# Patient Record
Sex: Male | Born: 1945 | Race: White | Hispanic: No | Marital: Married | Smoking: Former smoker
Health system: Southern US, Community
[De-identification: ages and names within clinical notes are randomized; demographics above are authoritative.]

## PROBLEM LIST (undated history)

## (undated) DIAGNOSIS — Z85038 Personal history of other malignant neoplasm of large intestine: Secondary | ICD-10-CM

## (undated) DIAGNOSIS — I10 Essential (primary) hypertension: Secondary | ICD-10-CM

## (undated) DIAGNOSIS — E7800 Pure hypercholesterolemia, unspecified: Secondary | ICD-10-CM

## (undated) DIAGNOSIS — K5900 Constipation, unspecified: Secondary | ICD-10-CM

## (undated) DIAGNOSIS — M5441 Lumbago with sciatica, right side: Secondary | ICD-10-CM

## (undated) DIAGNOSIS — I712 Thoracic aortic aneurysm, without rupture, unspecified: Secondary | ICD-10-CM

## (undated) DIAGNOSIS — M79604 Pain in right leg: Secondary | ICD-10-CM

## (undated) DIAGNOSIS — I2510 Atherosclerotic heart disease of native coronary artery without angina pectoris: Secondary | ICD-10-CM

## (undated) DIAGNOSIS — M1990 Unspecified osteoarthritis, unspecified site: Secondary | ICD-10-CM

## (undated) DIAGNOSIS — C160 Malignant neoplasm of cardia: Secondary | ICD-10-CM

## (undated) HISTORY — DX: Constipation, unspecified: K59.00

## (undated) HISTORY — DX: Pure hypercholesterolemia, unspecified: E78.00

## (undated) HISTORY — DX: Atherosclerotic heart disease of native coronary artery without angina pectoris: I25.10

## (undated) HISTORY — DX: Essential (primary) hypertension: I10

## (undated) HISTORY — DX: Pain in right leg: M79.604

## (undated) HISTORY — PX: CHOLECYSTECTOMY: SHX55

## (undated) HISTORY — DX: Personal history of other malignant neoplasm of large intestine: Z85.038

## (undated) HISTORY — DX: Malignant neoplasm of cardia: C16.0

## (undated) HISTORY — PX: PARTIAL COLECTOMY: SHX5273

## (undated) HISTORY — DX: Lumbago with sciatica, right side: M54.41

## (undated) HISTORY — DX: Unspecified osteoarthritis, unspecified site: M19.90

## (undated) HISTORY — DX: Thoracic aortic aneurysm, without rupture, unspecified: I71.20

---

## 2012-12-26 ENCOUNTER — Ambulatory Visit: Payer: Self-pay | Admitting: Ophthalmology

## 2013-01-16 ENCOUNTER — Ambulatory Visit: Payer: Self-pay | Admitting: Ophthalmology

## 2013-02-06 ENCOUNTER — Ambulatory Visit: Payer: Self-pay | Admitting: Ophthalmology

## 2014-11-15 DIAGNOSIS — Z8522 Personal history of malignant neoplasm of nasal cavities, middle ear, and accessory sinuses: Secondary | ICD-10-CM | POA: Diagnosis not present

## 2014-11-15 DIAGNOSIS — J189 Pneumonia, unspecified organism: Secondary | ICD-10-CM | POA: Diagnosis not present

## 2014-11-15 DIAGNOSIS — M1990 Unspecified osteoarthritis, unspecified site: Secondary | ICD-10-CM | POA: Diagnosis not present

## 2014-11-15 DIAGNOSIS — Z87891 Personal history of nicotine dependence: Secondary | ICD-10-CM | POA: Diagnosis not present

## 2014-11-15 DIAGNOSIS — Z1389 Encounter for screening for other disorder: Secondary | ICD-10-CM | POA: Diagnosis not present

## 2014-11-15 DIAGNOSIS — R0789 Other chest pain: Secondary | ICD-10-CM | POA: Diagnosis not present

## 2014-11-15 DIAGNOSIS — I7090 Unspecified atherosclerosis: Secondary | ICD-10-CM | POA: Diagnosis not present

## 2014-11-15 DIAGNOSIS — Z6828 Body mass index (BMI) 28.0-28.9, adult: Secondary | ICD-10-CM | POA: Diagnosis not present

## 2014-11-15 DIAGNOSIS — E780 Pure hypercholesterolemia: Secondary | ICD-10-CM | POA: Diagnosis not present

## 2014-11-15 DIAGNOSIS — I10 Essential (primary) hypertension: Secondary | ICD-10-CM | POA: Diagnosis not present

## 2014-11-15 DIAGNOSIS — Z125 Encounter for screening for malignant neoplasm of prostate: Secondary | ICD-10-CM | POA: Diagnosis not present

## 2014-11-28 DIAGNOSIS — R918 Other nonspecific abnormal finding of lung field: Secondary | ICD-10-CM | POA: Diagnosis not present

## 2014-11-28 DIAGNOSIS — J189 Pneumonia, unspecified organism: Secondary | ICD-10-CM | POA: Diagnosis not present

## 2014-12-02 DIAGNOSIS — R0602 Shortness of breath: Secondary | ICD-10-CM | POA: Diagnosis not present

## 2014-12-02 DIAGNOSIS — Z6828 Body mass index (BMI) 28.0-28.9, adult: Secondary | ICD-10-CM | POA: Diagnosis not present

## 2014-12-02 DIAGNOSIS — J189 Pneumonia, unspecified organism: Secondary | ICD-10-CM | POA: Diagnosis not present

## 2015-02-18 DIAGNOSIS — R42 Dizziness and giddiness: Secondary | ICD-10-CM | POA: Diagnosis not present

## 2015-02-28 NOTE — Op Note (Signed)
PATIENT NAME:  Patrick Braun, Patrick Braun MR#:  [id number] DATE OF BIRTH:  Aug 10, 1946  DATE OF PROCEDURE:  02/06/2013  PREOPERATIVE DIAGNOSIS: Visually significant cataract of the left eye.   POSTOPERATIVE DIAGNOSIS: Visually significant cataract of the left eye.   OPERATIVE PROCEDURE: Cataract extraction by phacoemulsification with implant of intraocular lens to left eye.   SURGEON: Birder Robson, MD.   ANESTHESIA:  1. Managed anesthesia care.  2. Topical tetracaine drops followed by 2% Xylocaine jelly applied in the preoperative holding area.   COMPLICATIONS: None.   TECHNIQUE:  Stop and chop.   DESCRIPTION OF PROCEDURE: The patient was examined and consented in the preoperative holding area where the aforementioned topical anesthesia was applied to the left eye and then brought back to the Operating Room where the left eye was prepped and draped in the usual sterile ophthalmic fashion and a lid speculum was placed. A paracentesis was created with the side port blade and the anterior chamber was filled with viscoelastic. A near clear corneal incision was performed with the steel keratome. A continuous curvilinear capsulorrhexis was performed with a cystotome followed by the capsulorrhexis forceps. Hydrodissection and hydrodelineation were carried out with BSS on a blunt cannula. The lens was removed in a   technique and the remaining cortical material was removed with the irrigation-aspiration handpiece. The capsular bag was inflated with viscoelastic and the Alcon SN60WF Tecnis ZCB00 19.5-diopter lens, serial number [id number] was placed in the capsular bag without complication. The remaining viscoelastic was removed from the eye with the irrigation-aspiration handpiece. The wounds were hydrated. The anterior chamber was flushed with Miostat and the eye was inflated to physiologic pressure. 0.1 mL of cefuroxime concentration 10 mg/mL was placed in the anterior chamber. The wounds were found to be water  tight. The eye was dressed with Vigamox. The patient was given protective glasses to wear throughout the day and a shield with which to sleep tonight. The patient was also given drops with which to begin a drop regimen today and will follow-up with me in one day.     ____________________________ Livingston Diones. Desere Gwin, MD wlp:ea D: 02/06/2013 22:03:09 ET T: 02/07/2013 00:46:40 ET JOB#: [id number]  cc: Shakyla Nolley L. Tamari Busic, MD, <Dictator> Livingston Diones Lorrain Rivers MD ELECTRONICALLY SIGNED 02/07/2013 10:30

## 2015-02-28 NOTE — Op Note (Signed)
PATIENT NAME:  Patrick Braun, HINEMAN MR#:  [id number] DATE OF BIRTH:  [birth year]  DATE OF PROCEDURE:  01/16/2013  PREOPERATIVE DIAGNOSIS: Visually significant cataract of the right eye.   POSTOPERATIVE DIAGNOSIS: Visually significant cataract of the right eye.   OPERATIVE PROCEDURE: Cataract extraction by phacoemulsification with implant of intraocular lens to right eye.   SURGEON: Birder Robson, MD.   ANESTHESIA:  1. Managed anesthesia care.  2. Topical tetracaine drops followed by 2% Xylocaine jelly applied in the preoperative holding area.   COMPLICATIONS: None.   TECHNIQUE:  Stop and chop   DESCRIPTION OF PROCEDURE: The patient was examined and consented in the preoperative holding area where the aforementioned topical anesthesia was applied to the right eye and then brought back to the Operating Room where the right eye was prepped and draped in the usual sterile ophthalmic fashion and a lid speculum was placed. A paracentesis was created with the side port blade and the anterior chamber was filled with viscoelastic. A near clear corneal incision was performed with the steel keratome. A continuous curvilinear capsulorrhexis was performed with a cystotome followed by the capsulorrhexis forceps. Hydrodissection and hydrodelineation were carried out with BSS on a blunt cannula. The lens was removed in a stop and chop technique and the remaining cortical material was removed with the irrigation-aspiration handpiece. The capsular bag was inflated with viscoelastic and the Tecnis ZCB00 20.0 diopter lens, serial #[id number] was placed in the capsular bag without complication. The remaining viscoelastic was removed from the eye with the irrigation-aspiration handpiece. The wounds were hydrated. The anterior chamber was flushed with Miostat and the eye was inflated to physiologic pressure. 0.1 mL of cefuroxime concentration 10 mg/mL was placed in the anterior chamber. The wounds were found to be water  tight. The eye was dressed with Vigamox. The patient was given protective glasses to wear throughout the day and a shield with which to sleep tonight. The patient was also given drops with which to begin a drop regimen today and will follow-up with me in one day.    ____________________________ Livingston Diones. Porfilio, MD wlp:cc D: 01/16/2013 21:52:29 ET T: 01/16/2013 23:21:05 ET JOB#: [id number]  cc: William L. Porfilio, MD, <Dictator> Livingston Diones PORFILIO MD ELECTRONICALLY SIGNED 01/19/2013 17:10

## 2015-09-11 DIAGNOSIS — Z23 Encounter for immunization: Secondary | ICD-10-CM | POA: Diagnosis not present

## 2015-12-25 DIAGNOSIS — J209 Acute bronchitis, unspecified: Secondary | ICD-10-CM | POA: Diagnosis not present

## 2015-12-25 DIAGNOSIS — J309 Allergic rhinitis, unspecified: Secondary | ICD-10-CM | POA: Diagnosis not present

## 2016-05-31 DIAGNOSIS — Z85038 Personal history of other malignant neoplasm of large intestine: Secondary | ICD-10-CM | POA: Diagnosis not present

## 2016-05-31 DIAGNOSIS — Z23 Encounter for immunization: Secondary | ICD-10-CM | POA: Diagnosis not present

## 2016-05-31 DIAGNOSIS — Z0000 Encounter for general adult medical examination without abnormal findings: Secondary | ICD-10-CM | POA: Diagnosis not present

## 2016-05-31 DIAGNOSIS — Z6827 Body mass index (BMI) 27.0-27.9, adult: Secondary | ICD-10-CM | POA: Diagnosis not present

## 2016-05-31 DIAGNOSIS — E7800 Pure hypercholesterolemia, unspecified: Secondary | ICD-10-CM | POA: Diagnosis not present

## 2016-05-31 DIAGNOSIS — I10 Essential (primary) hypertension: Secondary | ICD-10-CM | POA: Diagnosis not present

## 2016-06-08 DIAGNOSIS — Z85038 Personal history of other malignant neoplasm of large intestine: Secondary | ICD-10-CM | POA: Diagnosis not present

## 2016-06-08 DIAGNOSIS — Z6827 Body mass index (BMI) 27.0-27.9, adult: Secondary | ICD-10-CM | POA: Diagnosis not present

## 2016-06-14 DIAGNOSIS — K5730 Diverticulosis of large intestine without perforation or abscess without bleeding: Secondary | ICD-10-CM | POA: Diagnosis not present

## 2016-06-14 DIAGNOSIS — Z1211 Encounter for screening for malignant neoplasm of colon: Secondary | ICD-10-CM | POA: Diagnosis not present

## 2016-06-14 DIAGNOSIS — Z79899 Other long term (current) drug therapy: Secondary | ICD-10-CM | POA: Diagnosis not present

## 2016-06-14 DIAGNOSIS — Z85038 Personal history of other malignant neoplasm of large intestine: Secondary | ICD-10-CM | POA: Diagnosis not present

## 2016-07-05 DIAGNOSIS — S60221A Contusion of right hand, initial encounter: Secondary | ICD-10-CM | POA: Diagnosis not present

## 2016-10-01 DIAGNOSIS — J029 Acute pharyngitis, unspecified: Secondary | ICD-10-CM | POA: Diagnosis not present

## 2016-10-02 DIAGNOSIS — J029 Acute pharyngitis, unspecified: Secondary | ICD-10-CM | POA: Diagnosis not present

## 2016-10-02 DIAGNOSIS — R05 Cough: Secondary | ICD-10-CM | POA: Diagnosis not present

## 2016-10-02 DIAGNOSIS — R1310 Dysphagia, unspecified: Secondary | ICD-10-CM | POA: Diagnosis not present

## 2016-10-02 DIAGNOSIS — Z85038 Personal history of other malignant neoplasm of large intestine: Secondary | ICD-10-CM | POA: Diagnosis not present

## 2016-10-02 DIAGNOSIS — E7800 Pure hypercholesterolemia, unspecified: Secondary | ICD-10-CM | POA: Diagnosis not present

## 2016-10-02 DIAGNOSIS — Z87891 Personal history of nicotine dependence: Secondary | ICD-10-CM | POA: Diagnosis not present

## 2016-10-02 DIAGNOSIS — I10 Essential (primary) hypertension: Secondary | ICD-10-CM | POA: Diagnosis not present

## 2016-12-31 DIAGNOSIS — H10213 Acute toxic conjunctivitis, bilateral: Secondary | ICD-10-CM | POA: Diagnosis not present

## 2017-04-20 DIAGNOSIS — D3132 Benign neoplasm of left choroid: Secondary | ICD-10-CM | POA: Diagnosis not present

## 2017-10-11 DIAGNOSIS — E7800 Pure hypercholesterolemia, unspecified: Secondary | ICD-10-CM | POA: Diagnosis not present

## 2017-10-11 DIAGNOSIS — I10 Essential (primary) hypertension: Secondary | ICD-10-CM | POA: Diagnosis not present

## 2017-10-11 DIAGNOSIS — R0609 Other forms of dyspnea: Secondary | ICD-10-CM | POA: Diagnosis not present

## 2017-10-11 DIAGNOSIS — Z23 Encounter for immunization: Secondary | ICD-10-CM | POA: Diagnosis not present

## 2017-10-11 DIAGNOSIS — K219 Gastro-esophageal reflux disease without esophagitis: Secondary | ICD-10-CM | POA: Diagnosis not present

## 2017-10-11 DIAGNOSIS — Z0000 Encounter for general adult medical examination without abnormal findings: Secondary | ICD-10-CM | POA: Diagnosis not present

## 2017-10-11 DIAGNOSIS — Z87891 Personal history of nicotine dependence: Secondary | ICD-10-CM | POA: Diagnosis not present

## 2017-10-11 DIAGNOSIS — Z1159 Encounter for screening for other viral diseases: Secondary | ICD-10-CM | POA: Diagnosis not present

## 2020-02-07 NOTE — Progress Notes (Signed)
Chief Complaint  Patient presents with  . New Patient (Initial Visit)    dyspnea   History of Present Illness: 74 yo male with history of gastroesophageal cancer, colon cancer, HTN, hyperlipidemia who is here today as a new patient for the evaluation of dyspnea, chest pressure and fatigue. 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 He describes exertional dyspnea. 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 He becomes dyspneic easily now. No LE edema.   Primary Care Physician: Raelene Bott, MD   Past Medical History:  Diagnosis Date  . Acute right-sided low back pain with right-sided sciatica   . Constipation   . DJD (degenerative joint disease)   . Gastroesophageal cancer ([place])   . Hx of colon cancer, stage I   . Hypercholesteremia   . Hypertension   . Right leg pain     Past Surgical History:  Procedure Laterality Date  . CHOLECYSTECTOMY    . PARTIAL COLECTOMY      Current Outpatient Medications  Medication Sig Dispense Refill  . amlodipine-benazepril (LOTREL) 2.5-10 MG capsule Take 1 capsule by mouth daily.    . ASPIRIN 81 PO Take 1 tablet by mouth daily.    Marland Kitchen escitalopram (LEXAPRO) 10 MG tablet Take 10 mg by mouth daily.    Marland Kitchen loratadine (CLARITIN) 10 MG tablet Take 10 mg by mouth daily as needed for allergies.     No current facility-administered medications for this visit.    No Known Allergies  Social History   Socioeconomic History  . Marital status: Married    Spouse name: Not on file  . Number of children: Not on file  . Years of education: Not on file  . Highest education level: Not on file  Occupational History  . Occupation: Farmer  Tobacco Use  . Smoking status: Former Smoker    Quit date: 11/09/1971    Years since quitting: 48.2  . Smokeless tobacco: Never Used  Substance and Sexual Activity  . Alcohol use: Never  . Drug use: Not on file  . Sexual activity: Not on file  Other Topics Concern  . Not on file  Social History Narrative  . Not on file    Social Determinants of Health   Financial Resource Strain:   . Difficulty of Paying Living Expenses:   Food Insecurity:   . Worried About Charity fundraiser in the Last Year:   . Arboriculturist in the Last Year:   Transportation Needs:   . Film/video editor (Medical):   Marland Kitchen Lack of Transportation (Non-Medical):   Physical Activity:   . Days of Exercise per Week:   . Minutes of Exercise per Session:   Stress:   . Feeling of Stress :   Social Connections:   . Frequency of Communication with Friends and Family:   . Frequency of Social Gatherings with Friends and Family:   . Attends Religious Services:   . Active Member of Clubs or Organizations:   . Attends Archivist Meetings:   Marland Kitchen Marital Status:   Intimate Partner Violence:   . Fear of Current or Ex-Partner:   . Emotionally Abused:   Marland Kitchen Physically Abused:   . Sexually Abused:     Family History  Problem Relation Age of Onset  . Heart attack Father   . Stroke Mother     Review of Systems:  As stated in the HPI and otherwise negative.   BP 124/80   Pulse 68  Ht 6' (1.829 m)   Wt 197 lb (89.4 kg)   SpO2 99%   BMI 26.72 kg/m   Physical Examination: General: Well developed, well nourished, NAD  HEENT: OP clear, mucus membranes moist  SKIN: warm, dry. No rashes. Neuro: No focal deficits  Musculoskeletal: Muscle strength 5/5 all ext  Psychiatric: Mood and affect normal  Neck: No JVD, no carotid bruits, no thyromegaly, no lymphadenopathy.  Lungs:Clear bilaterally, no wheezes, rhonci, crackles Cardiovascular: Regular rate and rhythm. No murmurs, gallops or rubs. Abdomen:Soft. Bowel sounds present. Non-tender.  Extremities: No lower extremity edema. Pulses are 2 + in the bilateral DP/PT.  EKG:  EKG is ordered today. The ekg ordered today demonstrates Sinus, inferior Q waves  Recent Labs: No results found for requested labs within last 8760 hours.   Lipid Panel No results found for: CHOL, TRIG,  HDL, CHOLHDL, VLDL, LDLCALC, LDLDIRECT   Wt Readings from Last 3 Encounters:  02/08/20 197 lb (89.4 kg)    Assessment and Plan:   1.  Dyspnea and chest pressure: cardiac exam is normal. EKG shows inferior Q waves. Will arrange a nuclear stress test and an echo.   2. HTN: BP controlled. Continue current therapy  Current medicines are reviewed at length with the patient today.  The patient does not have concerns regarding medicines.  The following changes have been made:  no change  Labs/ tests ordered today include:   Orders Placed This Encounter  Procedures  . MYOCARDIAL PERFUSION IMAGING  . ECHOCARDIOGRAM COMPLETE     Disposition:   FU with me in 3-4 weeks.    Signed, Lauree Chandler, MD 02/08/2020 9:26 AM    [facility] [address], [place], [place]  [address] Phone: [phone]; Fax: [phone]

## 2020-02-08 ENCOUNTER — Ambulatory Visit (INDEPENDENT_AMBULATORY_CARE_PROVIDER_SITE_OTHER): Payer: Medicare Other | Admitting: Cardiovascular Disease

## 2020-02-08 ENCOUNTER — Encounter: Payer: Self-pay | Admitting: Nurse Practitioner

## 2020-02-08 ENCOUNTER — Other Ambulatory Visit: Payer: Self-pay

## 2020-02-08 ENCOUNTER — Encounter: Payer: Self-pay | Admitting: Cardiovascular Disease

## 2020-02-08 VITALS — BP 124/80 | HR 68 | Ht 72.0 in | Wt 197.0 lb

## 2020-02-08 DIAGNOSIS — R0609 Other forms of dyspnea: Secondary | ICD-10-CM

## 2020-02-08 DIAGNOSIS — R072 Precordial pain: Secondary | ICD-10-CM

## 2020-02-08 DIAGNOSIS — R0600 Dyspnea, unspecified: Secondary | ICD-10-CM

## 2020-02-08 NOTE — Patient Instructions (Signed)
Medication Instructions:  Your physician recommends that you continue on your current medications as directed. Please refer to the Current Medication list given to you today.  *If you need a refill on your cardiac medications before your next appointment, please call your pharmacy*   Lab Work: None Ordered If you have labs (blood work) drawn today and your tests are completely normal, you will receive your results only by: Marland Kitchen MyChart Message (if you have MyChart) OR . A paper copy in the mail If you have any lab test that is abnormal or we need to change your treatment, we will call you to review the results.   Testing/Procedures: Your physician has requested that you have an echocardiogram. Echocardiography is a painless test that uses sound waves to create images of your heart. It provides your doctor with information about the size and shape of your heart and how well your heart's chambers and valves are working. This procedure takes approximately one hour. There are no restrictions for this procedure.  Your Pre-procedure COVID-19 Testing (needed before your Exercise Stress Test) will be done on ______ at [facility] at [id number], [place], [place] [address]. Once you arrive at the testing site, stay in the right hand lane, go under the building overhang not the tent. If you are tested under the tent your results may not be back before your procedure. Please be on time for your appointment.  After your swab you will be given a mask to wear and instructed to go home and quarantine/no visitors until after your procedure. If you test positive you will be notified and your procedure will be cancelled.    Your physician has requested that you have an exercise stress myoview. For further information please visit [email or web address]. Please follow instruction sheet, as given.    Follow-Up: At [facility], you and your health needs are our priority.  As part  of our continuing mission to provide you with exceptional heart care, we have created designated Provider Care Teams.  These Care Teams include your primary Cardiologist (physician) and Advanced Practice Providers (APPs -  Physician Assistants and Nurse Practitioners) who all work together to provide you with the care you need, when you need it.  We recommend signing up for the patient portal called "MyChart".  Sign up information is provided on this After Visit Summary.  MyChart is used to connect with patients for Virtual Visits (Telemedicine).  Patients are able to view lab/test results, encounter notes, upcoming appointments, etc.  Non-urgent messages can be sent to your provider as well.   To learn more about what you can do with MyChart, go to [email or web address].    Your next appointment:   4 week(s) after Stress Test and Echo  The format for your next appointment:   In Person  Provider:   Lauree Chandler, MD

## 2020-02-13 ENCOUNTER — Telehealth (HOSPITAL_COMMUNITY): Payer: Self-pay | Admitting: *Deleted

## 2020-02-13 NOTE — Telephone Encounter (Signed)
Patient given detailed instructions per Myocardial Perfusion Study Information Sheet for the test on 02/18/20 at 8:00. Patient notified to arrive 15 minutes early and that it is imperative to arrive on time for appointment to keep from having the test rescheduled.  If you need to cancel or reschedule your appointment, please call the office within 24 hours of your appointment. . Patient verbalized understanding.Patrick Braun

## 2020-02-14 ENCOUNTER — Other Ambulatory Visit (HOSPITAL_COMMUNITY)
Admission: RE | Admit: 2020-02-14 | Discharge: 2020-02-14 | Disposition: A | Discharge status: Home / Self Care | Payer: Medicare Other | Source: Ambulatory Visit | Attending: Cardiovascular Disease | Admitting: Cardiovascular Disease

## 2020-02-14 DIAGNOSIS — Z20822 Contact with and (suspected) exposure to covid-19: Secondary | ICD-10-CM | POA: Insufficient documentation

## 2020-02-14 DIAGNOSIS — Z01812 Encounter for preprocedural laboratory examination: Secondary | ICD-10-CM | POA: Insufficient documentation

## 2020-02-14 LAB — SARS CORONAVIRUS 2 (TAT 6-24 HRS): SARS Coronavirus 2: NEGATIVE

## 2020-02-18 ENCOUNTER — Ambulatory Visit (HOSPITAL_COMMUNITY): Payer: Medicare Other | Attending: Cardiology

## 2020-02-18 ENCOUNTER — Other Ambulatory Visit: Payer: Self-pay

## 2020-02-18 DIAGNOSIS — R072 Precordial pain: Secondary | ICD-10-CM

## 2020-02-18 LAB — MYOCARDIAL PERFUSION IMAGING
Estimated workload: 10.1 METS
Exercise duration (min): 8 min
Exercise duration (sec): 48 s
LV dias vol: 69 mL (ref 62–150)
LV sys vol: 25 mL
MPHR: 145 {beats}/min
Peak HR: 146 {beats}/min
Percent HR: 100 %
Rest HR: 68 {beats}/min
SDS: 1
SRS: 0
SSS: 4
TID: 0.69

## 2020-02-18 MED ORDER — TECHNETIUM TC 99M TETROFOSMIN IV KIT
31.6000 | PACK | Freq: Once | INTRAVENOUS | Status: AC | PRN
  Administered 2020-02-18: 31.6 via INTRAVENOUS
  Filled 2020-02-18: qty 32

## 2020-02-18 MED ORDER — TECHNETIUM TC 99M TETROFOSMIN IV KIT
10.4000 | PACK | Freq: Once | INTRAVENOUS | Status: AC | PRN
  Administered 2020-02-18: 10.4 via INTRAVENOUS
  Filled 2020-02-18: qty 11

## 2020-02-19 ENCOUNTER — Ambulatory Visit (HOSPITAL_COMMUNITY): Payer: Medicare Other | Attending: Internal Medicine

## 2020-02-19 DIAGNOSIS — R0600 Dyspnea, unspecified: Secondary | ICD-10-CM | POA: Diagnosis not present

## 2020-02-19 DIAGNOSIS — R072 Precordial pain: Secondary | ICD-10-CM | POA: Diagnosis not present

## 2020-02-19 DIAGNOSIS — R0609 Other forms of dyspnea: Secondary | ICD-10-CM

## 2020-02-19 NOTE — Addendum Note (Signed)
Addended by: Janan Halter F on: 02/19/2020 10:23 AM   Modules accepted: Orders

## 2020-02-20 ENCOUNTER — Telehealth: Payer: Self-pay | Admitting: *Deleted

## 2020-02-20 DIAGNOSIS — I77810 Thoracic aortic ectasia: Secondary | ICD-10-CM

## 2020-02-20 NOTE — Telephone Encounter (Signed)
-----   Message from Burnell Blanks, MD sent at 02/19/2020 12:09 PM EDT ----- His heart is strong. There is mild valve disease. His aortic root is mildly enlarged. Can we let him know and arrange a chest CTA to evaluate his aorta? Thanks, chris

## 2020-02-20 NOTE — Telephone Encounter (Signed)
I spoke with patient and reviewed echo and stress test results with him. He will need BMP prior to CTA.  He is currently out of town and will call us next week to schedule lab work.  Order for CTA and lab work can be placed at that time.

## 2020-02-27 NOTE — Telephone Encounter (Signed)
Patient's wife called to let us know that Patrick Braun is back in town. She would like a call back to schedule lab work. I did not see any orders in.

## 2020-02-27 NOTE — Telephone Encounter (Signed)
Placed orders for BMET and CTA chest to look at aorta. Pt will come tomorrow for BMET. He has f/u with Dr. Angelena Form on 5/3 and would prefer to have scan done ASAP.  Note to [facility] for scheduling.

## 2020-02-28 ENCOUNTER — Other Ambulatory Visit: Payer: Medicare Other | Admitting: *Deleted

## 2020-02-28 ENCOUNTER — Other Ambulatory Visit: Payer: Self-pay

## 2020-02-28 DIAGNOSIS — I77810 Thoracic aortic ectasia: Secondary | ICD-10-CM

## 2020-02-28 LAB — BASIC METABOLIC PANEL
BUN/Creatinine Ratio: 9 — ABNORMAL LOW (ref 10–24)
BUN: 9 mg/dL (ref 8–27)
CO2: 25 mmol/L (ref 20–29)
Calcium: 9.7 mg/dL (ref 8.6–10.2)
Chloride: 104 mmol/L (ref 96–106)
Creatinine, Ser: 1.05 mg/dL (ref 0.76–1.27)
GFR calc Af Amer: 80 mL/min/{1.73_m2} (ref >59)
GFR calc non Af Amer: 69 mL/min/{1.73_m2} (ref >59)
Glucose: 73 mg/dL (ref 65–99)
Potassium: 4.2 mmol/L (ref 3.5–5.2)
Sodium: 140 mmol/L (ref 134–144)

## 2020-03-05 ENCOUNTER — Other Ambulatory Visit: Payer: Self-pay

## 2020-03-05 ENCOUNTER — Ambulatory Visit (HOSPITAL_COMMUNITY)
Admission: RE | Admit: 2020-03-05 | Discharge: 2020-03-05 | Disposition: A | Discharge status: Home / Self Care | Payer: Medicare Other | Source: Ambulatory Visit | Attending: Cardiovascular Disease | Admitting: Cardiovascular Disease

## 2020-03-05 DIAGNOSIS — I77810 Thoracic aortic ectasia: Secondary | ICD-10-CM | POA: Diagnosis not present

## 2020-03-05 MED ORDER — IOHEXOL 350 MG/ML SOLN
100.0000 mL | Freq: Once | INTRAVENOUS | Status: AC | PRN
  Administered 2020-03-05: 100 mL via INTRAVENOUS

## 2020-03-10 ENCOUNTER — Other Ambulatory Visit: Payer: Self-pay

## 2020-03-10 ENCOUNTER — Encounter: Payer: Self-pay | Admitting: Cardiovascular Disease

## 2020-03-10 ENCOUNTER — Ambulatory Visit (INDEPENDENT_AMBULATORY_CARE_PROVIDER_SITE_OTHER): Payer: Medicare Other | Admitting: Cardiovascular Disease

## 2020-03-10 VITALS — BP 126/80 | HR 75 | Ht 72.0 in | Wt 198.8 lb

## 2020-03-10 DIAGNOSIS — R0609 Other forms of dyspnea: Secondary | ICD-10-CM

## 2020-03-10 DIAGNOSIS — I77810 Thoracic aortic ectasia: Secondary | ICD-10-CM | POA: Diagnosis not present

## 2020-03-10 DIAGNOSIS — R0600 Dyspnea, unspecified: Secondary | ICD-10-CM

## 2020-03-10 NOTE — Progress Notes (Signed)
Chief Complaint  Patient presents with  . Follow-up    Dyspnea   History of Present Illness: 74 yo male with history of gastroesophageal cancer, colon cancer, HTN, hyperlipidemia who is here today for cardiac follow up. I saw him as a new patient for the evaluation of dyspnea, chest pressure and fatigue on 02/08/20. 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 He described exertional dyspnea. 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 He becomes dyspneic easily now. No LE edema. Nuclear stress test 02/18/20 with no ischemia. Echo April 2021 with LVEF=55-60% with no wall motion abnormalities. Mild MR. Mild AI. Mild dilation aortic root. Chest CTA with mild dilation of the aortic root, 4.4 cm.   He is here today for follow up. The patient denies any chest pain, palpitations, lower extremity edema, orthopnea, PND, dizziness, near syncope or syncope. He continues to have dyspnea with heavy exertion.   Primary Care Physician: Raelene Bott, MD   Past Medical History:  Diagnosis Date  . Acute right-sided low back pain with right-sided sciatica   . Constipation   . DJD (degenerative joint disease)   . Gastroesophageal cancer ([place])   . Hx of colon cancer, stage I   . Hypercholesteremia   . Hypertension   . Right leg pain     Past Surgical History:  Procedure Laterality Date  . CHOLECYSTECTOMY    . PARTIAL COLECTOMY      Current Outpatient Medications  Medication Sig Dispense Refill  . amlodipine-benazepril (LOTREL) 2.5-10 MG capsule Take 1 capsule by mouth daily.    . ASPIRIN 81 PO Take 1 tablet by mouth daily.    Marland Kitchen escitalopram (LEXAPRO) 10 MG tablet Take 10 mg by mouth daily.    Marland Kitchen loratadine (CLARITIN) 10 MG tablet Take 10 mg by mouth daily as needed for allergies.     No current facility-administered medications for this visit.    No Known Allergies  Social History   Socioeconomic History  . Marital status: Married    Spouse name: Not on file  . Number of children: Not on file  . Years  of education: Not on file  . Highest education level: Not on file  Occupational History  . Occupation: Farmer  Tobacco Use  . Smoking status: Former Smoker    Quit date: 11/09/1971    Years since quitting: 48.3  . Smokeless tobacco: Never Used  Substance and Sexual Activity  . Alcohol use: Never  . Drug use: Never  . Sexual activity: Not on file  Other Topics Concern  . Not on file  Social History Narrative  . Not on file   Social Determinants of Health   Financial Resource Strain:   . Difficulty of Paying Living Expenses:   Food Insecurity:   . Worried About Charity fundraiser in the Last Year:   . Arboriculturist in the Last Year:   Transportation Needs:   . Film/video editor (Medical):   Marland Kitchen Lack of Transportation (Non-Medical):   Physical Activity:   . Days of Exercise per Week:   . Minutes of Exercise per Session:   Stress:   . Feeling of Stress :   Social Connections:   . Frequency of Communication with Friends and Family:   . Frequency of Social Gatherings with Friends and Family:   . Attends Religious Services:   . Active Member of Clubs or Organizations:   . Attends Archivist Meetings:   Marland Kitchen Marital Status:  Intimate Partner Violence:   . Fear of Current or Ex-Partner:   . Emotionally Abused:   Marland Kitchen Physically Abused:   . Sexually Abused:     Family History  Problem Relation Age of Onset  . Heart attack Father   . Stroke Mother     Review of Systems:  As stated in the HPI and otherwise negative.   BP 126/80   Pulse 75   Ht 6' (1.829 m)   Wt 198 lb 12.8 oz (90.2 kg)   SpO2 99%   BMI 26.96 kg/m   Physical Examination: General: Well developed, well nourished, NAD  HEENT: OP clear, mucus membranes moist  SKIN: warm, dry. No rashes. Neuro: No focal deficits  Musculoskeletal: Muscle strength 5/5 all ext  Psychiatric: Mood and affect normal  Neck: No JVD, no carotid bruits, no thyromegaly, no lymphadenopathy.  Lungs:Clear bilaterally,  no wheezes, rhonci, crackles Cardiovascular: Regular rate and rhythm. No murmurs, gallops or rubs. Abdomen:Soft. Bowel sounds present. Non-tender.  Extremities: No lower extremity edema. Pulses are 2 + in the bilateral DP/PT.  EKG:  EKG is not ordered today. The ekg ordered today demonstrates   Echo April 2021: 1. Left ventricular ejection fraction, by estimation, is 55 to 60%. The  left ventricle has normal function. The left ventricle has no regional  wall motion abnormalities. There is mild asymmetric left ventricular  hypertrophy of the basal-septal segment.  Left ventricular diastolic parameters were normal.  2. Right ventricular systolic function is normal. The right ventricular  size is normal. There is normal pulmonary artery systolic pressure. The  estimated right ventricular systolic pressure is 123XX123 mmHg.  3. Mild chordal SAM. The mitral valve is abnormal. Mild mitral valve  regurgitation. No evidence of mitral stenosis.  4. The aortic valve is abnormal. Aortic valve regurgitation is mild. Mild  aortic valve sclerosis is present, with no evidence of aortic valve  stenosis.  5. Aortic dilatation noted. There is mild dilatation of the aortic root  and of the ascending aorta measuring 42 mm.   Recent Labs: 02/28/2020: BUN 9; Creatinine, Ser 1.05; Potassium 4.2; Sodium 140   Lipid Panel No results found for: CHOL, TRIG, HDL, CHOLHDL, VLDL, LDLCALC, LDLDIRECT   Wt Readings from Last 3 Encounters:  03/10/20 198 lb 12.8 oz (90.2 kg)  02/08/20 197 lb (89.4 kg)    Assessment and Plan:   1.  Dyspnea and chest pressure: No ischemia on stress test. LV function is normal. He is asked to call us if his symptoms worsen. The next step would be a cardiac cath.    2. HTN: BP is controlled. Continue current therapy.   3. 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 Repeat one year.   Current medicines are reviewed at length with the patient today.   The patient does not have concerns regarding medicines.  The following changes have been made:  no change  Labs/ tests ordered today include:   No orders of the defined types were placed in this encounter.   Disposition:   FU with me in 12 months.    Signed, Lauree Chandler, MD 03/10/2020 2:58 PM    [facility] [place], [place], [place]  [address] Phone: [phone]; Fax: [phone]

## 2020-03-10 NOTE — Patient Instructions (Signed)

## 2020-03-14 ENCOUNTER — Other Ambulatory Visit: Payer: Medicare Other

## 2021-03-24 ENCOUNTER — Other Ambulatory Visit: Payer: Self-pay | Admitting: *Deleted

## 2021-04-23 DIAGNOSIS — I712 Thoracic aortic aneurysm, without rupture, unspecified: Secondary | ICD-10-CM | POA: Insufficient documentation

## 2021-04-23 NOTE — Progress Notes (Signed)
, No Cardiology Office Note:    Date:  04/24/2021   ID:  Patrick Braun, DOB 04-29-1946, MRN [id number]  PCP:  Raelene Bott, MD   [facility] Providers Cardiologist:  Lauree Chandler, MD      Referring MD: Raelene Bott, MD   Chief Complaint:  Follow-up (Shortness of breath, chest pain )    Patient Profile:    Patrick Braun is a 74 y.o. male with:  Gastroesophageal CA Colon CA Hypertension  Hyperlipidemia  Rheumatic fever as a child Mild MR, Mild AI by echocardiogram in 2021 Dyspnea, chest pain  Myoview 4/21: no ischemia Echocardiogram 4/21: EF 55-60 Thoracic aortic aneurysm (CT 2021: 44 mm)   Prior CV studies: Chest CTA 03/05/20 IMPRESSION: 1. Fusiform aneurysmal dilatation of the ascending thoracic aorta measuring 4.4 cm at the level of the right main pulmonary artery. No dissection. Recommend annual imaging followup by CTA or MRA. This recommendation follows 2010 ACCF/AHA/AATS/ACR/ASA/SCA/SCAI/SIR/STS/SVM Guidelines for the Diagnosis and Management of Patients with Thoracic Aortic Disease. Circulation. 2010; 121: N829-F621. Aortic aneurysm NOS (ICD10-I71.9) 2. Calcifications near the aortic valve. 3. Emphysematous changes but no acute pulmonary findings or worrisome pulmonary lesions. 4. Emphysema and aortic atherosclerosis. Aortic atherosclerosis.  Echocardiogram 02/19/20 EF 55-60, no RWMA, mild asymmetric LVH, normal diastolic function, normal RVSF, RVSP 20.6, mild MR, mild chordal SAM, mild AI, mild AV sclerosis without stenosis, dilated ascending aorta (42 mm)  GATED SPECT MYO PERF W/EXERCISE STRESS 1D 02/18/2020 Narrative  The left ventricular ejection fraction is normal (55-65%).  Nuclear stress EF: 65%.  There was no ST segment deviation noted during stress.  This is a low risk study.  The study is normal.    History of Present Illness: Patrick Braun was last seen by Dr. Angelena Form in 5/21.  It was noted that the patient would need a cardiac  catheterization if his symptoms of chest pain and shortness of breath worsen.  He returns for f/u.  He is here alone.  He continues to have issues with shortness of breath with exertion.  He works on a farm.  He typically gets chest pressure associated with this.  He has not had any change in his symptoms over the past 1 year.  He has not had orthopnea, syncope, leg edema.        Past Medical History:  Diagnosis Date   Acute right-sided low back pain with right-sided sciatica    Constipation    DJD (degenerative joint disease)    Gastroesophageal cancer (HCC)    Hx of colon cancer, stage I    Hypercholesteremia    Hypertension    Right leg pain     Current Medications: Current Meds  Medication Sig   loratadine (CLARITIN) 10 MG tablet Take 10 mg by mouth daily as needed for allergies.   metoprolol succinate (TOPROL XL) 25 MG 24 hr tablet Take 1 tablet (25 mg total) by mouth daily.   [DISCONTINUED] amlodipine-benazepril (LOTREL) 2.5-10 MG capsule Take 1 capsule by mouth daily.     Allergies:   Patient has no known allergies.   Social History   Tobacco Use   Smoking status: Former    Pack years: 0.00    Types: Cigarettes    Quit date: 11/09/1971    Years since quitting: 49.4   Smokeless tobacco: Never  Vaping Use   Vaping Use: Never used  Substance Use Topics   Alcohol use: Never   Drug use: Never     Family Hx: The patient's  family history includes Heart attack in his father; Stroke in his mother.  Review of Systems  Respiratory:  Negative for cough and wheezing.     EKGs/Labs/Other Test Reviewed:    EKG:  EKG is  ordered today.  The ekg ordered today demonstrates normal sinus rhythm, heart rate 76, inferior Q waves, no ST-T wave changes, QTC 447, no change from prior tracing  Recent Labs: No results found for requested labs within last 8760 hours.   Recent Lipid Panel No results found for: CHOL, TRIG, HDL, LDLCALC, LDLDIRECT    Risk Assessment/Calculations:       Physical Exam:    VS:  BP (!) 150/90 (BP Location: Left Arm, Patient Position: Sitting, Cuff Size: Normal)   Pulse 76   Ht 6' (1.829 m)   Wt 197 lb (89.4 kg)   SpO2 97%   BMI 26.72 kg/m     Wt Readings from Last 3 Encounters:  04/24/21 197 lb (89.4 kg)  03/10/20 198 lb 12.8 oz (90.2 kg)  02/08/20 197 lb (89.4 kg)     Constitutional:      Appearance: Healthy appearance. Not in distress.  Neck:     Vascular: JVD normal.  Pulmonary:     Effort: Pulmonary effort is normal.     Breath sounds: No wheezing. No rales.  Cardiovascular:     Normal rate. Regular rhythm. Normal S1. Normal S2.      Murmurs: There is no murmur.  Edema:    Peripheral edema absent.  Abdominal:     Palpations: Abdomen is soft.  Skin:    General: Skin is warm and dry.  Neurological:     General: No focal deficit present.     Mental Status: Alert and oriented to person, place and time.     Cranial Nerves: Cranial nerves are intact.        ASSESSMENT & PLAN:    1. Dyspnea on exertion He has had stable symptoms of exertional shortness of breath with associated chest pressure for the past year.  His electrocardiogram is unchanged.  He does have a prior history of smoking and emphysematous changes on his chest CT last year.  We discussed further testing to include right and left heart catheterization (risks and benefits reviewed) versus coronary CTA versus cardiopulmonary stress testing.  He prefers noninvasive testing at first.  I will discuss further with Dr. Angelena Form to determine which test to start with.  He works on a farm.  When it is hot and he is sweating, he tends to hold his blood pressure medicines.  He has not taken Lotrel for the past few days.  We discussed medical therapy for coronary artery disease.  Given his low risk Myoview from last year, I think medical Rx is reasonable.  I have recommended that we keep him off of Lotrel and start metoprolol succinate 25 mg daily.  I have asked him to send  me some blood pressure readings after a couple of weeks.  If his blood pressure is running too high, we can certainly add back either amlodipine or benazepril.  Follow-up with Dr. Angelena Form in several months.  2. Thoracic aortic aneurysm without rupture ([place]) I will discuss with Dr. Angelena Form further testing as noted.  If we do not get a Coronary CTA, I will arrange a chest CTA to recheck his aneurysm.    3. Essential hypertension BP uncontrolled.  He has not had Lotrel the last few days.  Adjust meds as noted.  4. Mixed hyperlipidemia Managed by PCP.  If CAD documented, he will need statin Rx.  He notes that he does not like to take meds.           Dispo:  Return in about 3 months (around 07/25/2021) for Routine follow up in 3 months with Dr.McAlhany. .   Medication Adjustments/Labs and Tests Ordered: Current medicines are reviewed at length with the patient today.  Concerns regarding medicines are outlined above.  Tests Ordered: Orders Placed This Encounter  Procedures   EKG 12-Lead   Medication Changes: Meds ordered this encounter  Medications   metoprolol succinate (TOPROL XL) 25 MG 24 hr tablet    Sig: Take 1 tablet (25 mg total) by mouth daily.    Dispense:  30 tablet    Refill:  [address] Richardson Dopp, [place]  04/24/2021 11:03 AM    [facility] [place], [place], [place]  [address] Phone: [phone]; Fax: [phone]

## 2021-04-24 ENCOUNTER — Other Ambulatory Visit: Payer: Self-pay

## 2021-04-24 ENCOUNTER — Telehealth: Payer: Self-pay | Admitting: Physician Assistant

## 2021-04-24 ENCOUNTER — Encounter: Payer: Self-pay | Admitting: Physician Assistant

## 2021-04-24 ENCOUNTER — Ambulatory Visit (INDEPENDENT_AMBULATORY_CARE_PROVIDER_SITE_OTHER): Payer: Medicare Other | Admitting: Physician Assistant

## 2021-04-24 VITALS — BP 150/90 | HR 76 | Ht 72.0 in | Wt 197.0 lb

## 2021-04-24 DIAGNOSIS — E782 Mixed hyperlipidemia: Secondary | ICD-10-CM

## 2021-04-24 DIAGNOSIS — R0600 Dyspnea, unspecified: Secondary | ICD-10-CM

## 2021-04-24 DIAGNOSIS — I10 Essential (primary) hypertension: Secondary | ICD-10-CM

## 2021-04-24 DIAGNOSIS — I712 Thoracic aortic aneurysm, without rupture, unspecified: Secondary | ICD-10-CM

## 2021-04-24 DIAGNOSIS — R0609 Other forms of dyspnea: Secondary | ICD-10-CM

## 2021-04-24 DIAGNOSIS — R072 Precordial pain: Secondary | ICD-10-CM

## 2021-04-24 MED ORDER — METOPROLOL TARTRATE 100 MG PO TABS: ORAL_TABLET | ORAL | 0 refills | Status: DC

## 2021-04-24 MED ORDER — METOPROLOL SUCCINATE ER 25 MG PO TB24: 25.0000 mg | ORAL_TABLET | Freq: Every day | ORAL | 11 refills | Status: DC

## 2021-04-24 NOTE — Telephone Encounter (Signed)
Call placed to pt to schedule to advise him that Dr. Angelena Form and Richardson Dopp, PA-C, has decided to pursue with a Cardiac CT to further evaluate his symptoms.  Your cardiac CT will be scheduled at one of the below locations:   [facility] [address] [place], [place] [address] [phone]   At [facility], please arrive at the [facility] main entrance (entrance A) of [facility] 30 minutes prior to test start time. Proceed to the [facility] Radiology Department (first floor) to check-in and test prep.   Please follow these instructions carefully (unless otherwise directed):  Hold all erectile dysfunction medications at least 3 days (72 hrs) prior to test.  On the Night Before the Test: Be sure to Drink plenty of water. Do not consume any caffeinated/decaffeinated beverages or chocolate 12 hours prior to your test. Do not take any antihistamines 12 hours prior to your test.   On the Day of the Test: Drink plenty of water until 1 hour prior to the test. Do not eat any food 4 hours prior to the test. You may take your regular medications prior to the test.  Take metoprolol (Lopressor) 100 mg two hours prior to test. DO NOT TAKE THE 25 MG YOU NORMALLY TAKE IN THE MORNINGS        After the Test: Drink plenty of water. After receiving IV contrast, you may experience a mild flushed feeling. This is normal. On occasion, you may experience a mild rash up to 24 hours after the test. This is not dangerous. If this occurs, you can take Benadryl 25 mg and increase your fluid intake. If you experience trouble breathing, this can be serious. If it is severe call 911 IMMEDIATELY. If it is mild, please call our office. If you take any of these medications: Glipizide/Metformin, Avandament, Glucavance, please do not take 48 hours after completing test unless otherwise instructed.   Once we have confirmed authorization from your insurance company, we will call  you to set up a date and time for your test. Based on how quickly your insurance processes prior authorizations requests, please allow up to 4 weeks to be contacted for scheduling your Cardiac CT appointment. Be advised that routine Cardiac CT appointments could be scheduled as many as 8 weeks after your provider has ordered it.  For non-scheduling related questions, please contact the cardiac imaging nurse navigator should you have any questions/concerns: Marchia Bond, Cardiac Imaging Nurse Navigator Gordy Clement, Cardiac Imaging Nurse Navigator [facility] Direct Office Dial: [phone]   For scheduling needs, including cancellations and rescheduling, please call [place], [phone].

## 2021-04-24 NOTE — Telephone Encounter (Signed)
Please call the patient. I discussed with Dr. Angelena Form.  We decided to proceed with coronary CTA to further evaluate his symptoms.  This will also help [place] reassess his thoracic aortic aneurysm. PLAN: Arrange coronary CTA Dx: Exertional chest pain and shortness of breath; thoracic aortic aneurysm I will place the order. Richardson Dopp, PA-C    04/24/2021 12:21 PM

## 2021-04-24 NOTE — Telephone Encounter (Signed)
Pt will need to have BMET drawn a couple days prior to the CT.  Please call to arrange once CT has been scheduled.

## 2021-04-24 NOTE — Patient Instructions (Signed)
Medication Instructions:  Your physician recommends that you continue on your current medications as directed. Please refer to the Current Medication list given to you today.  STOP LOTREL  START TOPROL XL one tablet by mouth ( 25 mg) daily.   *If you need a refill on your cardiac medications before your next appointment, please call your pharmacy*   Lab Work:  -NONE If you have labs (blood work) drawn today and your tests are completely normal, you will receive your results only by: [place] (if you have MyChart) OR A paper copy in the mail If you have any lab test that is abnormal or we need to change your treatment, we will call you to review the results.   Testing/Procedures:  -NONE   Follow-Up: At [facility], you and your health needs are our priority.  As part of our continuing mission to provide you with exceptional heart care, we have created designated Provider Care Teams.  These Care Teams include your primary Cardiologist (physician) and Advanced Practice Providers (APPs -  Physician Assistants and Nurse Practitioners) who all work together to provide you with the care you need, when you need it.  We recommend signing up for the patient portal called "MyChart".  Sign up information is provided on this After Visit Summary.  MyChart is used to connect with patients for Virtual Visits (Telemedicine).  Patients are able to view lab/test results, encounter notes, upcoming appointments, etc.  Non-urgent messages can be sent to your provider as well.   To learn more about what you can do with MyChart, go to [email or web address].    Your next appointment:   3 month(s) with Dr. Angelena Form Thursday, September 22 @ 9:40 am.   The format for your next appointment:   In Person  Provider:   Lauree Chandler, MD   Other Instructions  -NONE

## 2021-04-27 NOTE — Telephone Encounter (Signed)
Readings yesterday were closer to goal. Check BP once a day (at least 3 hours after taking medication). Make sure pt is seated, feet on the floor, at rest for 20 minutes.  Make sure arm is at heart level when taking BP. Send readings to me via MyChart on Friday. If pt has BP >150/90 twice in a row or more, resume the Lotrel 2.5/10 mg once daily (in addition to current meds) and let us know. Richardson Dopp, PA-C    04/27/2021 12:43 PM

## 2021-05-04 ENCOUNTER — Other Ambulatory Visit: Payer: Self-pay | Admitting: Cardiovascular Disease

## 2021-05-04 ENCOUNTER — Other Ambulatory Visit: Payer: Self-pay

## 2021-05-04 ENCOUNTER — Other Ambulatory Visit: Payer: Medicare Other | Admitting: *Deleted

## 2021-05-04 ENCOUNTER — Other Ambulatory Visit (HOSPITAL_COMMUNITY): Payer: Self-pay | Admitting: Cardiovascular Disease

## 2021-05-04 DIAGNOSIS — R0609 Other forms of dyspnea: Secondary | ICD-10-CM

## 2021-05-04 DIAGNOSIS — R072 Precordial pain: Secondary | ICD-10-CM

## 2021-05-04 DIAGNOSIS — I712 Thoracic aortic aneurysm, without rupture, unspecified: Secondary | ICD-10-CM

## 2021-05-04 LAB — BASIC METABOLIC PANEL
BUN/Creatinine Ratio: 11 (ref 10–24)
BUN: 12 mg/dL (ref 8–27)
CO2: 23 mmol/L (ref 20–29)
Calcium: 9.2 mg/dL (ref 8.6–10.2)
Chloride: 103 mmol/L (ref 96–106)
Creatinine, Ser: 1.05 mg/dL (ref 0.76–1.27)
Glucose: 73 mg/dL (ref 65–99)
Potassium: 4.1 mmol/L (ref 3.5–5.2)
Sodium: 139 mmol/L (ref 134–144)
eGFR: 74 mL/min/{1.73_m2} (ref >59)

## 2021-05-05 ENCOUNTER — Telehealth (HOSPITAL_COMMUNITY): Payer: Self-pay | Admitting: Emergency Medicine

## 2021-05-05 ENCOUNTER — Ambulatory Visit (HOSPITAL_COMMUNITY): Payer: Medicare Other

## 2021-05-05 NOTE — Telephone Encounter (Signed)
Reaching out to patient to offer assistance regarding upcoming cardiac imaging study; pt verbalizes understanding of appt date/time, parking situation and where to check in, pre-test NPO status and medications ordered, and verified current allergies; name and call back number provided for further questions should they arise Marchia Bond RN Navigator Cardiac Imaging [facility] [phone] office [phone] cell  100mg  metoprolol tartrate 2 hr prior

## 2021-05-07 ENCOUNTER — Other Ambulatory Visit (HOSPITAL_COMMUNITY): Payer: Medicare Other

## 2021-05-07 ENCOUNTER — Other Ambulatory Visit: Payer: Self-pay

## 2021-05-07 ENCOUNTER — Encounter: Payer: Self-pay | Admitting: Physician Assistant

## 2021-05-07 ENCOUNTER — Ambulatory Visit (HOSPITAL_COMMUNITY)
Admission: RE | Admit: 2021-05-07 | Discharge: 2021-05-07 | Disposition: A | Discharge status: Home / Self Care | Payer: Medicare Other | Source: Ambulatory Visit | Attending: Physician Assistant | Admitting: Physician Assistant

## 2021-05-07 DIAGNOSIS — R0609 Other forms of dyspnea: Secondary | ICD-10-CM

## 2021-05-07 DIAGNOSIS — I712 Thoracic aortic aneurysm, without rupture, unspecified: Secondary | ICD-10-CM

## 2021-05-07 DIAGNOSIS — R0600 Dyspnea, unspecified: Secondary | ICD-10-CM | POA: Diagnosis present

## 2021-05-07 DIAGNOSIS — R072 Precordial pain: Secondary | ICD-10-CM | POA: Diagnosis present

## 2021-05-07 DIAGNOSIS — I2510 Atherosclerotic heart disease of native coronary artery without angina pectoris: Secondary | ICD-10-CM | POA: Insufficient documentation

## 2021-05-07 MED ORDER — NITROGLYCERIN 0.4 MG SL SUBL
0.8000 mg | SUBLINGUAL_TABLET | Freq: Once | SUBLINGUAL | Status: AC
  Administered 2021-05-07: 0.8 mg via SUBLINGUAL

## 2021-05-07 MED ORDER — IOHEXOL 350 MG/ML SOLN
100.0000 mL | Freq: Once | INTRAVENOUS | Status: AC | PRN
  Administered 2021-05-07: 100 mL via INTRAVENOUS

## 2021-05-07 MED ORDER — NITROGLYCERIN 0.4 MG SL SUBL
SUBLINGUAL_TABLET | SUBLINGUAL | Status: AC
  Filled 2021-05-07: qty 2

## 2021-05-07 NOTE — Progress Notes (Signed)
CT scan completed. Tolerated well. D/C home ambulatory with daughter. Awake and alert. In no distress

## 2021-05-08 ENCOUNTER — Telehealth: Payer: Self-pay | Admitting: *Deleted

## 2021-05-08 DIAGNOSIS — I712 Thoracic aortic aneurysm, without rupture, unspecified: Secondary | ICD-10-CM

## 2021-05-08 NOTE — Telephone Encounter (Signed)
-----   Message from Liliane Shi, [place] sent at 05/07/2021  5:55 PM EDT ----- CT demonstrates stable size of ascending aorta.  There is evidence of mild nonobstructive coronary artery disease.  The findings on his coronary CTA indicate that his shortness of breath is not coming from a blockage.  It would be reasonable to take a cholesterol-lowering medication to reduce future risk. PLAN:  -If he is willing, start rosuvastatin 10 mg daily and repeat lipids, LFTs in 3 months -Keep follow-up with Dr. Angelena Form as planned -Repeat chest CTA in 1 year (thoracic aortic aneurysm) -order under Dr. Chipper Herb, PA-C    05/07/2021 5:46 PM

## 2021-05-08 NOTE — Telephone Encounter (Signed)
The patient has been notified of the result and verbalized understanding.  Allquestions (if any) were answered. Darrell Jewel, RN 05/08/2021 8:51 AM     The patient does not wish to start rosuvastatin at this time.  Routed to [place] as [employer]

## 2021-07-30 ENCOUNTER — Encounter: Payer: Self-pay | Admitting: Cardiovascular Disease

## 2021-07-30 ENCOUNTER — Other Ambulatory Visit: Payer: Self-pay

## 2021-07-30 ENCOUNTER — Ambulatory Visit (INDEPENDENT_AMBULATORY_CARE_PROVIDER_SITE_OTHER): Payer: Medicare Other | Admitting: Cardiovascular Disease

## 2021-07-30 VITALS — BP 138/70 | HR 66 | Ht 72.0 in | Wt 199.8 lb

## 2021-07-30 DIAGNOSIS — I712 Thoracic aortic aneurysm, without rupture, unspecified: Secondary | ICD-10-CM

## 2021-07-30 DIAGNOSIS — I2510 Atherosclerotic heart disease of native coronary artery without angina pectoris: Secondary | ICD-10-CM | POA: Diagnosis not present

## 2021-07-30 NOTE — Progress Notes (Signed)
Chief Complaint  Patient presents with   Follow-up    CAD    History of Present Illness: 75 yo male with history of mild CAD, thoracic aortic aneurysm, gastroesophageal cancer, colon cancer, HTN, hyperlipidemia who is here today for cardiac follow up. I saw him as a new patient for the evaluation of dyspnea, chest pressure and fatigue on 02/08/20. 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 He described exertional dyspnea. 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 Nuclear stress test 02/18/20 with no ischemia. Echo April 2021 with LVEF=55-60% with no wall motion abnormalities. Mild MR. Mild AI. Mild dilation aortic root. Chest CTA with dilation of the aortic root, 4.4 cm. Coronary CTA June 2022 with coronary calcium score of 25 and minimal coronary plaque.   He is here today for follow up. The patient denies any chest pain, palpitations, lower extremity edema, orthopnea, PND, dizziness, near syncope or syncope. Still with some fatigue and dyspnea with heavy exertion.     Primary Care Physician: Raelene Bott, MD   Past Medical History:  Diagnosis Date   Acute right-sided low back pain with right-sided sciatica    CAD (coronary artery disease)    Coronary CTA 6/22: Ascending aorta 44 mm, calcium score 25 (16th percentile), minimal nonobstructive CAD (1-24 in the RCA and LAD)   Constipation    DJD (degenerative joint disease)    Gastroesophageal cancer (HCC)    Hx of colon cancer, stage I    Hypercholesteremia    Hypertension    Right leg pain    Thoracic aortic aneurysm ([place])    CT 6/22: 4mm    Past Surgical History:  Procedure Laterality Date   CHOLECYSTECTOMY     PARTIAL COLECTOMY      Current Outpatient Medications  Medication Sig Dispense Refill   loratadine (CLARITIN) 10 MG tablet Take 10 mg by mouth daily as needed for allergies.     metoprolol succinate (TOPROL XL) 25 MG 24 hr tablet Take 1 tablet (25 mg total) by mouth daily. 30 tablet 11   No current facility-administered  medications for this visit.    No Known Allergies  Social History   Socioeconomic History   Marital status: Married    Spouse name: Not on file   Number of children: Not on file   Years of education: Not on file   Highest education level: Not on file  Occupational History   Occupation: Psychologist, sport and exercise  Tobacco Use   Smoking status: Former    Types: Cigarettes    Quit date: 11/09/1971    Years since quitting: 49.7   Smokeless tobacco: Never  Vaping Use   Vaping Use: Never used  Substance and Sexual Activity   Alcohol use: Never   Drug use: Never   Sexual activity: Not on file  Other Topics Concern   Not on file  Social History Narrative   Not on file   Social Determinants of Health   Financial Resource Strain: Not on file  Food Insecurity: Not on file  Transportation Needs: Not on file  Physical Activity: Not on file  Stress: Not on file  Social Connections: Not on file  Intimate Partner Violence: Not on file    Family History  Problem Relation Age of Onset   Heart attack Father    Stroke Mother     Review of Systems:  As stated in the HPI and otherwise negative.   BP 138/70   Pulse 66   Ht 6' (1.829 m)  Wt 199 lb 12.8 oz (90.6 kg)   SpO2 98%   BMI 27.10 kg/m   Physical Examination: General: Well developed, well nourished, NAD  HEENT: OP clear, mucus membranes moist  SKIN: warm, dry. No rashes. Neuro: No focal deficits  Musculoskeletal: Muscle strength 5/5 all ext  Psychiatric: Mood and affect normal  Neck: No JVD, no carotid bruits, no thyromegaly, no lymphadenopathy.  Lungs:Clear bilaterally, no wheezes, rhonci, crackles Cardiovascular: Regular rate and rhythm. No murmurs, gallops or rubs. Abdomen:Soft. Bowel sounds present. Non-tender.  Extremities: No lower extremity edema. Pulses are 2 + in the bilateral DP/PT.  EKG:  EKG is not ordered today. The ekg ordered today demonstrates   Echo April 2021:  1. Left ventricular ejection fraction, by  estimation, is 55 to 60%. The  left ventricle has normal function. The left ventricle has no regional  wall motion abnormalities. There is mild asymmetric left ventricular  hypertrophy of the basal-septal segment.  Left ventricular diastolic parameters were normal.   2. Right ventricular systolic function is normal. The right ventricular  size is normal. There is normal pulmonary artery systolic pressure. The  estimated right ventricular systolic pressure is 22.0 mmHg.   3. Mild chordal SAM. The mitral valve is abnormal. Mild mitral valve  regurgitation. No evidence of mitral stenosis.   4. The aortic valve is abnormal. Aortic valve regurgitation is mild. Mild  aortic valve sclerosis is present, with no evidence of aortic valve  stenosis.   5. Aortic dilatation noted. There is mild dilatation of the aortic root  and of the ascending aorta measuring 42 mm.   Coronary CTA 05/07/21: 1. Coronary calcium score of 25. This was 16th percentile for age, sex, and race matched control.   2. Normal coronary origin with left dominance.   3. CAD-RADS 1. Minimal non-obstructive CAD (1-24%). Consider non-atherosclerotic causes of chest pain. Consider preventive therapy and risk factor modification.   4. Mild dilation of the ascending aorta 44 mm. Aortic atherosclerosis. Please seen concomitant CT Aorta for more detail.   5. Aortic valve calcium score of 341.   6. Mild mitral annular calcification.   7. Coronary sinus dilation 20 mm.  Recent Labs: 05/04/2021: BUN 12; Creatinine, Ser 1.05; Potassium 4.1; Sodium 139   Lipid Panel No results found for: CHOL, TRIG, HDL, CHOLHDL, VLDL, LDLCALC, LDLDIRECT   Wt Readings from Last 3 Encounters:  07/30/21 199 lb 12.8 oz (90.6 kg)  04/24/21 197 lb (89.4 kg)  03/10/20 198 lb 12.8 oz (90.2 kg)    Assessment and Plan:   1.  CAD without angina: He has very mild CAD on cardiac CT June 2022. I do not think his dyspnea is related to his mild CAD. LV  function is normal. Will start ASA 81 mg daily. Continue Toprol.    2. HTN: BP is well controlled. No changes  3. Thoracic aortic aneurysm: 4.4 cm ascending aorta on chest CTA July 2022. Repeat one year.   Current medicines are reviewed at length with the patient today.  The patient does not have concerns regarding medicines.  The following changes have been made:  no change  Labs/ tests ordered today include:   No orders of the defined types were placed in this encounter.   Disposition:   F/U with me in 12 months.    Signed, Lauree Chandler, MD 07/30/2021 10:17 AM    [facility] [place], [place], [place]  [address] Phone: [phone]; Fax: [phone]

## 2021-07-30 NOTE — Patient Instructions (Signed)
Medication Instructions:  No changes *If you need a refill on your cardiac medications before your next appointment, please call your pharmacy*   Lab Work: none   Testing/Procedures: July 2023 - repeat chest ct aorta - will need lab work before   Follow-Up: At [facility], you and your health needs are our priority.  As part of our continuing mission to provide you with exceptional heart care, we have created designated Provider Care Teams.  These Care Teams include your primary Cardiologist (physician) and Advanced Practice Providers (APPs -  Physician Assistants and Nurse Practitioners) who all work together to provide you with the care you need, when you need it.   Your next appointment:   12 month(s)  The format for your next appointment:   In Person  Provider:   You may see Lauree Chandler, MD or one of the following Advanced Practice Providers on your designated Care Team:   Melina Copa, PA-C Ermalinda Barrios, PA-C

## 2021-12-15 IMAGING — CT CT HEART MORP W/ CTA COR W/ SCORE W/ CA W/CM &/OR W/O CM
3 of 7 series · 8 of 20 positions shown, 9 images · IV contrast (APPLIED)
Comparison: None.

Addendum:
CLINICAL DATA: 74 Year-old White Male

EXAM:
Cardiac/Coronary  CTA
TECHNIQUE: The patient was scanned on a [employer] Force scanner.

[Series 4: best diast · axial · 0.40mm/px · z∈[+1339,+1487]mm · 4 of 615 slices shown, 5 images]
[im 123/615  vessel]
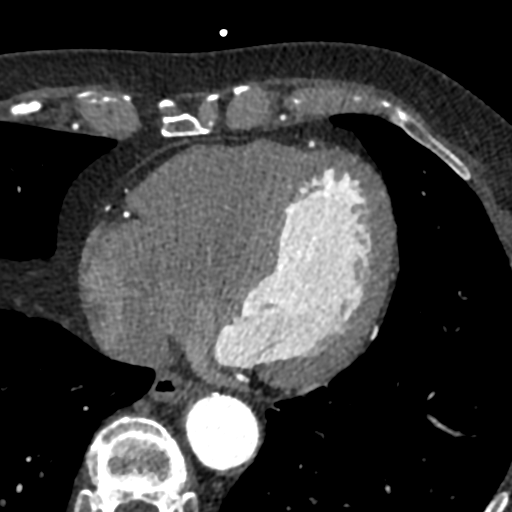
[im 123/615  lung]
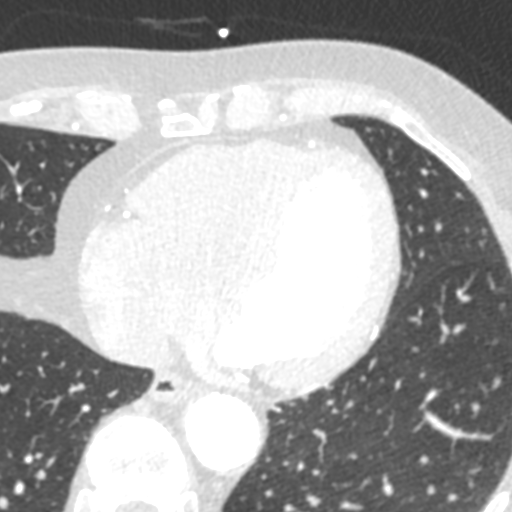
[im 246/615  vessel]
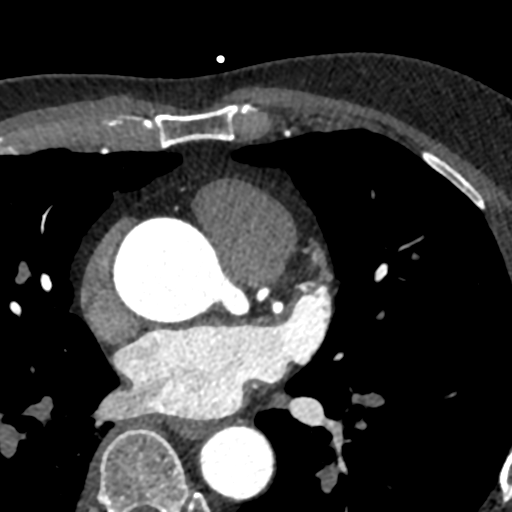
[im 369/615  vessel]
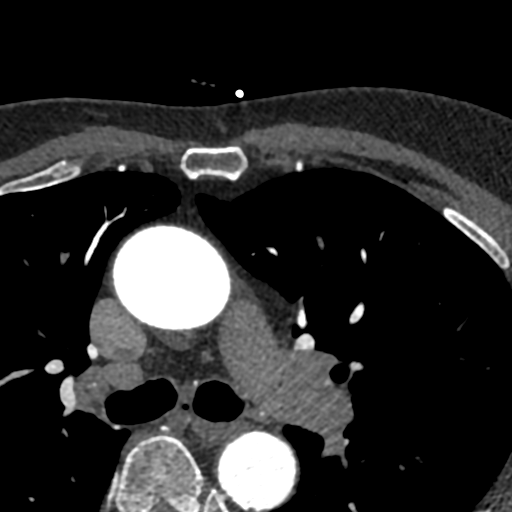
[im 492/615  vessel]
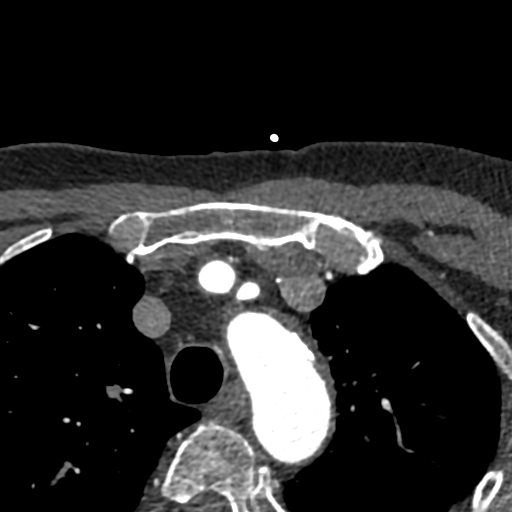

[Series 6: ts diast · axial · 0.40mm/px · z∈[+1336,+1382]mm · 2 of 345 slices shown]
[im 115/345  vessel]
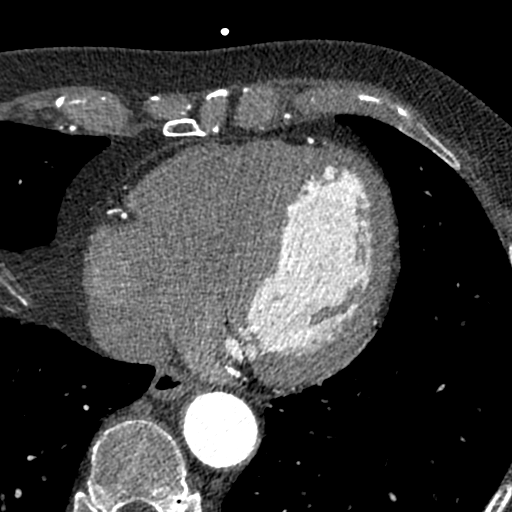
[im 230/345  vessel]
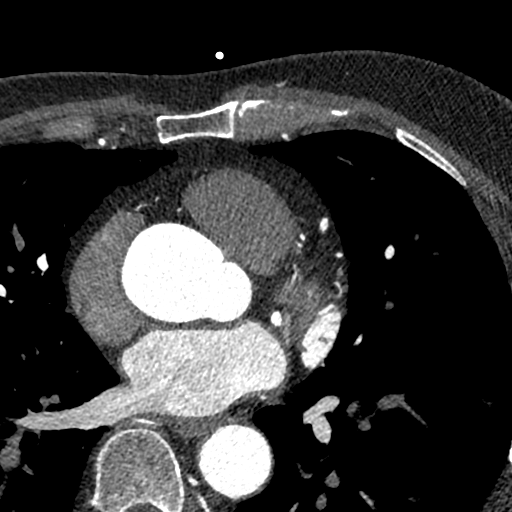

[Series 7: ts syst · axial · 0.40mm/px · z∈[+1336,+1382]mm · 2 of 345 slices shown]
[im 115/345  vessel]
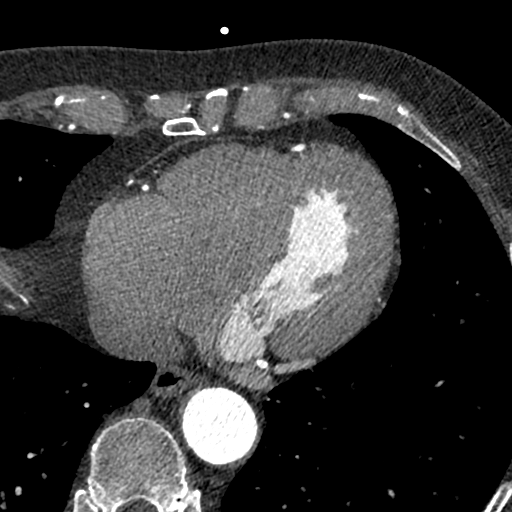
[im 230/345  vessel]
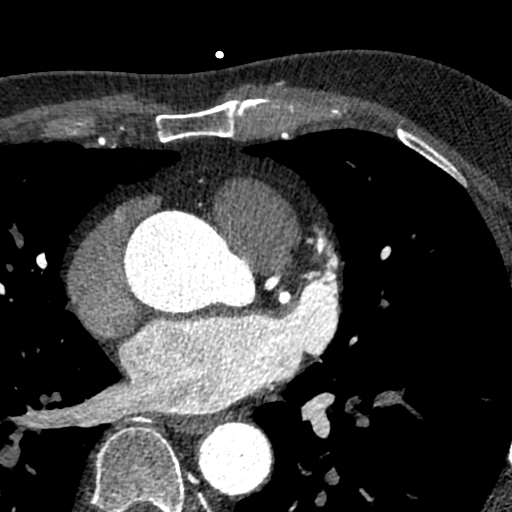

[8 of 20 positions shown; findings below may reference images not displayed]

FINDINGS: A 100 kV prospective scan was triggered in the descending thoracic
aorta at 111 HU's. Axial non-contrast 3 mm slices were carried out
through the heart. The data set was analyzed on a dedicated work
station and scored using the Agatson method. Gantry rotation speed
was 250 msecs and collimation was .6 mm. No beta blockade and 0.8 mg
of sl NTG was given. The 3D data set was reconstructed in 5%
intervals of the 67-82 % of the R-R cycle. Diastolic phases were
analyzed on a dedicated work station using MPR, MIP and VRT modes.
The patient received 100 cc of contrast.

Aorta: Mild dilation of the ascending aorta 44 mm. Aortic
atherosclerosis. No dissection. Please seen concomitant CT Aorta for
more detail.

Main Pulmonary Artery: Normal size of the pulmonary artery.

Aortic Valve: Tri-leaflet. Mild calcification- calcium score of 341.

Mitral Valve: Mild mitral annular calcification.

Coronary Arteries:  Normal coronary origin.  Left dominance.

Coronary Calcium Score:

Left main: 0

Left anterior descending artery: 25

Left circumflex artery: 0

Right coronary artery: 0

Total: 25

Percentile: 16th for age, sex, and race matched control.

RCA is a non-dominant artery. There is a minimal non-obstructive
(1-24%) soft plaque in the mid RCA.

Left main is a large artery that gives rise to LAD and LCX arteries.
There is no plaque.

LAD is a large vessel that gives rise to two diagonal branches.
There is minimal non-obstructive (1-24%) mixed plaque in the mid
LAD.

LCX is a dominant artery that gives rise to two OM vessels. There is
no plaque.

Other findings:

Normal pulmonary vein drainage into the left atrium.

Normal left atrial appendage without a thrombus.

Coronary sinus dilation 20 mm.

Extra-cardiac findings: See attached radiology report for
non-cardiac structures.
IMPRESSION: 1. Coronary calcium score of 25. This was 16th percentile for age,
sex, and race matched control.

2. Normal coronary origin with left dominance.

3. CAD-RADS 1. Minimal non-obstructive CAD (1-24%). Consider
non-atherosclerotic causes of chest pain. Consider preventive
therapy and risk factor modification.

4. Mild dilation of the ascending aorta 44 mm. Aortic
atherosclerosis. Please seen concomitant CT Aorta for more detail.

5. Aortic valve calcium score of 341.

6. Mild mitral annular calcification.

7. Coronary sinus dilation 20 mm.

RECOMMENDATIONS:



If CAC = 0, it is reasonable to withhold statin therapy and reassess
in 5 to 10 years, as long as higher risk conditions are absent
(diabetes mellitus, family history of premature CHD in first degree
relatives (males <55 years; females <65 years), cigarette smoking,
LDL >=190 mg/dL or other independent risk factors).

If CAC is 1 to 99, it is reasonable to initiate statin therapy for
patients ?55 years of age.

If CAC is >=100 or >=75th percentile, it is reasonable to initiate
statin therapy at any age.

Cardiology referral should be considered for patients with CAC
scores =400 or >=75th percentile.

*2628 AHA/ACC/AACVPR/AAPA/ABC/GAONA/ROSENBLUM/CRISOSTOMO/Rantona/SYD/JUNIUS/MOHMAD
Guideline on the Management of Blood Cholesterol: A Report of the
American College of Cardiology/American Heart Association Task Force
on Clinical Practice Guidelines. J Am Coll Cardiol.
4184;73(24):4175-4826.

EXAM:
OVER-READ INTERPRETATION  CT CHEST

The following report is an over-read performed by radiologist Dr.
over-read does not include interpretation of cardiac or coronary
anatomy or pathology. The coronary CTA interpretation by the
cardiologist is attached.
FINDINGS: Limited view of the lung parenchyma demonstrates no significant
extracardiac. Airways are normal.

Limited view of the mediastinum demonstrates no adenopathy.
Esophagus normal.

Limited view of the upper abdomen unremarkable.

Limited view of the skeleton and chest wall is unremarkable.
IMPRESSION: No significant extracardiac findings

*** End of Addendum ***
FINDINGS: A 100 kV prospective scan was triggered in the descending thoracic
aorta at 111 HU's. Axial non-contrast 3 mm slices were carried out
through the heart. The data set was analyzed on a dedicated work
station and scored using the Agatson method. Gantry rotation speed
was 250 msecs and collimation was .6 mm. No beta blockade and 0.8 mg
of sl NTG was given. The 3D data set was reconstructed in 5%
intervals of the 67-82 % of the R-R cycle. Diastolic phases were
analyzed on a dedicated work station using MPR, MIP and VRT modes.
The patient received 100 cc of contrast.

Aorta: Mild dilation of the ascending aorta 44 mm. Aortic
atherosclerosis. No dissection. Please seen concomitant CT Aorta for
more detail.

Main Pulmonary Artery: Normal size of the pulmonary artery.

Aortic Valve: Tri-leaflet. Mild calcification- calcium score of 341.

Mitral Valve: Mild mitral annular calcification.

Coronary Arteries:  Normal coronary origin.  Left dominance.

Coronary Calcium Score:

Left main: 0

Left anterior descending artery: 25

Left circumflex artery: 0

Right coronary artery: 0

Total: 25

Percentile: 16th for age, sex, and race matched control.

RCA is a non-dominant artery. There is a minimal non-obstructive
(1-24%) soft plaque in the mid RCA.

Left main is a large artery that gives rise to LAD and LCX arteries.
There is no plaque.

LAD is a large vessel that gives rise to two diagonal branches.
There is minimal non-obstructive (1-24%) mixed plaque in the mid
LAD.

LCX is a dominant artery that gives rise to two OM vessels. There is
no plaque.

Other findings:

Normal pulmonary vein drainage into the left atrium.

Normal left atrial appendage without a thrombus.

Coronary sinus dilation 20 mm.

Extra-cardiac findings: See attached radiology report for
non-cardiac structures.
IMPRESSION: 1. Coronary calcium score of 25. This was 16th percentile for age,
sex, and race matched control.

2. Normal coronary origin with left dominance.

3. CAD-RADS 1. Minimal non-obstructive CAD (1-24%). Consider
non-atherosclerotic causes of chest pain. Consider preventive
therapy and risk factor modification.

4. Mild dilation of the ascending aorta 44 mm. Aortic
atherosclerosis. Please seen concomitant CT Aorta for more detail.

5. Aortic valve calcium score of 341.

6. Mild mitral annular calcification.

7. Coronary sinus dilation 20 mm.

RECOMMENDATIONS:



If CAC = 0, it is reasonable to withhold statin therapy and reassess
in 5 to 10 years, as long as higher risk conditions are absent
(diabetes mellitus, family history of premature CHD in first degree
relatives (males <55 years; females <65 years), cigarette smoking,
LDL >=190 mg/dL or other independent risk factors).

If CAC is 1 to 99, it is reasonable to initiate statin therapy for
patients ?55 years of age.

If CAC is >=100 or >=75th percentile, it is reasonable to initiate
statin therapy at any age.

Cardiology referral should be considered for patients with CAC
scores =400 or >=75th percentile.

*2628 AHA/ACC/AACVPR/AAPA/ABC/GAONA/ROSENBLUM/CRISOSTOMO/Rantona/SYD/JUNIUS/MOHMAD
Guideline on the Management of Blood Cholesterol: A Report of the
American College of Cardiology/American Heart Association Task Force
on Clinical Practice Guidelines. J Am Coll Cardiol.
4184;73(24):4175-4826.

## 2022-02-02 ENCOUNTER — Telehealth: Payer: Self-pay | Admitting: Cardiovascular Disease

## 2022-02-02 NOTE — Telephone Encounter (Signed)
Spoke with patient's wife, Katharine Look (on [place]). ? ?Per Dr. Angelena Form, patient scheduled for appointment 02/04/22 at 10:30AM for evaluation of increased SOB. ? ?Katharine Look and patient verbalized understanding. ?

## 2022-02-02 NOTE — Telephone Encounter (Signed)
Pt c/o Shortness Of Breath: STAT if SOB developed within the last 24 hours or pt is noticeably SOB on the phone ? ?1. Are you currently SOB (can you hear that pt is SOB on the phone)?  ?No  ? ?2. How long have you been experiencing SOB?  ?Patient's wife states the patient is always SOB, but it has become progressively worse over the past few months. ? ?3. Are you SOB when sitting or when up moving around?  ?When up and moving around  ? ?4. Are you currently experiencing any other symptoms?  ?No  ? ?

## 2022-02-02 NOTE — Telephone Encounter (Signed)
Spoke with patient's wife, Katharine Look (on [place]). She states patient has chronic SOB, but it has worsened over the past 2-3 months. She states he does not have any swelling, denies any weight changes, denies CP/dizziness. Patient denies any changes in appetite, reports good appetite.  ? ?Patient reports some coughing/wheezing he believes is related to allergies, denies coughing up any mucus.  ? ?Katharine Look reports patient gets short of breath and fatigued with activity (more than usual over the past few months), relieved by rest. Patient continues to work on his farm. ? ?Will send to Dr. Angelena Form to review and advise. ?

## 2022-02-03 NOTE — Progress Notes (Signed)
? ?Chief Complaint  ?Patient presents with  ? Follow-up  ?  Dyspnea on exertion  ? ?History of Present Illness: 76 yo male with history of mild CAD, thoracic aortic aneurysm, gastroesophageal cancer, colon cancer, HTN, hyperlipidemia who is here today for cardiac follow up. I saw him as a new patient for the evaluation of dyspnea, chest pressure and fatigue on 02/08/20. 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 He described exertional dyspnea. 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 Nuclear stress test 02/18/20 with no ischemia. Echo April 2021 with LVEF=55-60% with no wall motion abnormalities. Mild MR. Mild AI. Mild dilation aortic root. Chest CTA with dilation of the aortic root, 4.4 cm. Coronary CTA June 2022 with coronary calcium score of 25 and minimal coronary plaque in the LAD and RCA.  ? ?He is here today for follow up. He called our office and reported worsened dyspnea on exertion. He denies any palpitations, lower extremity edema, orthopnea, PND, dizziness, near syncope or syncope. He has had chronic dyspnea but seems to be worse lately. Mild chest pressure at times. CT chest last year with bilateral lung scarring. 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  ? ?Primary Care Physician: Raelene Bott, MD ? ?Past Medical History:  ?Diagnosis Date  ? Acute right-sided low back pain with right-sided sciatica   ? CAD (coronary artery disease)   ? Coronary CTA 6/22: Ascending aorta 44 mm, calcium score 25 (16th percentile), minimal nonobstructive CAD (1-24 in the RCA and LAD)  ? Constipation   ? DJD (degenerative joint disease)   ? Gastroesophageal cancer ([place])   ? Hx of colon cancer, stage I   ? Hypercholesteremia   ? Hypertension   ? Right leg pain   ? Thoracic aortic aneurysm   ? CT 6/22: 16m  ? ? ?Past Surgical History:  ?Procedure Laterality Date  ? CHOLECYSTECTOMY    ? PARTIAL COLECTOMY    ? ? ?Current Outpatient Medications  ?Medication Sig Dispense Refill  ? aspirin 81 MG  EC tablet Take 1 tablet by mouth daily.    ? escitalopram (LEXAPRO) 10 MG tablet Take 10 mg by mouth daily.    ? loratadine (CLARITIN) 10 MG tablet Take 10 mg by mouth daily as needed for allergies.    ? metoprolol succinate (TOPROL XL) 25 MG 24 hr tablet Take 1 tablet (25 mg total) by mouth daily. 30 tablet 11  ? ?No current facility-administered medications for this visit.  ? ? ?No Known Allergies ? ?Social History  ? ?Socioeconomic History  ? Marital status: Married  ?  Spouse name: Not on file  ? Number of children: Not on file  ? Years of education: Not on file  ? Highest education level: Not on file  ?Occupational History  ? Occupation: [employer] ?Tobacco Use  ? Smoking status: Former  ?  Types: Cigarettes  ?  Quit date: 11/09/1971  ?  Years since quitting: 50.2  ? Smokeless tobacco: Never  ?Vaping Use  ? Vaping Use: Never used  ?Substance and Sexual Activity  ? Alcohol use: Never  ? Drug use: Never  ? Sexual activity: Not on file  ?Other Topics Concern  ? Not on file  ?Social History Narrative  ? Not on file  ? ?Social Determinants of Health  ? ?Financial Resource Strain: Not on file  ?Food Insecurity: Not on file  ?Transportation Needs: Not on file  ?Physical Activity: Not on  file  ?Stress: Not on file  ?Social Connections: Not on file  ?Intimate Partner Violence: Not on file  ? ? ?Family History  ?Problem Relation Age of Onset  ? Heart attack Father   ? Stroke Mother   ? ? ?Review of Systems:  As stated in the HPI and otherwise negative.  ? ?BP 128/62   Pulse 62   Ht 6' (1.829 m)   Wt 197 lb 12.8 oz (89.7 kg)   SpO2 99%   BMI 26.83 kg/m?  ? ?Physical Examination: ?General: Well developed, well nourished, NAD  ?HEENT: OP clear, mucus membranes moist  ?SKIN: warm, dry. No rashes. ?Neuro: No focal deficits  ?Musculoskeletal: Muscle strength 5/5 all ext  ?Psychiatric: Mood and affect normal  ?Neck: No JVD, no carotid bruits, no thyromegaly, no lymphadenopathy.  ?Lungs:Clear bilaterally, no wheezes, rhonci,  crackles ?Cardiovascular: Regular rate and rhythm. No murmurs, gallops or rubs. ?Abdomen:Soft. Bowel sounds present. Non-tender.  ?Extremities: No lower extremity edema. Pulses are 2 + in the bilateral DP/PT. ? ?EKG:  EKG is  ordered today. ?The ekg ordered today demonstrates Sinus, inferior Q waves-unchanged ? ?Echo April 2021: ? 1. Left ventricular ejection fraction, by estimation, is 55 to 60%. The  ?left ventricle has normal function. The left ventricle has no regional  ?wall motion abnormalities. There is mild asymmetric left ventricular  ?hypertrophy of the basal-septal segment.  ?Left ventricular diastolic parameters were normal.  ? 2. Right ventricular systolic function is normal. The right ventricular  ?size is normal. There is normal pulmonary artery systolic pressure. The  ?estimated right ventricular systolic pressure is 77.4 mmHg.  ? 3. Mild chordal SAM. The mitral valve is abnormal. Mild mitral valve  ?regurgitation. No evidence of mitral stenosis.  ? 4. The aortic valve is abnormal. Aortic valve regurgitation is mild. Mild  ?aortic valve sclerosis is present, with no evidence of aortic valve  ?stenosis.  ? 5. Aortic dilatation noted. There is mild dilatation of the aortic root  ?and of the ascending aorta measuring 42 mm.  ? ?Coronary CTA 05/07/21: ?1. Coronary calcium score of 25. This was 16th percentile for age, ?sex, and race matched control. ?  ?2. Normal coronary origin with left dominance. ?  ?3. CAD-RADS 1. Minimal non-obstructive CAD (1-24%). Consider ?non-atherosclerotic causes of chest pain. Consider preventive ?therapy and risk factor modification. ?  ?4. Mild dilation of the ascending aorta 44 mm. Aortic ?atherosclerosis. Please seen concomitant CT Aorta for more detail. ?  ?5. Aortic valve calcium score of 341. ?  ?6. Mild mitral annular calcification. ?  ?7. Coronary sinus dilation 20 mm. ? ?Recent Labs: ?05/04/2021: BUN 12; Creatinine, Ser 1.05; Potassium 4.1; Sodium 139  ? ?Lipid  Panel ?No results found for: CHOL, TRIG, HDL, CHOLHDL, VLDL, LDLCALC, LDLDIRECT ?  ?Wt Readings from Last 3 Encounters:  ?02/04/22 197 lb 12.8 oz (89.7 kg)  ?07/30/21 199 lb 12.8 oz (90.6 kg)  ?04/24/21 197 lb (89.4 kg)  ?  ?Assessment and Plan:  ? ?1.  CAD without angina: He has very mild CAD on cardiac CT June 2022. I do not think his dyspnea is related to his mild CAD. LV function was normal in 2021. Given worsened dyspnea, we have discussed a definitive right and left heart cath. I will start by repeating his echo first. Continue ASA and Toprol. He does not wish to take a statin. We reviewed this today. ?  ?2. HTN: BP is controlled. No changes ? ?3. Thoracic aortic aneurysm: 4.4  cm ascending aorta on chest CTA July 2022. Repeat in July 2023.  ? ?4. Dyspnea on exertion: I suspect that his dyspnea is related to his lungs. 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 No prior pulmonary workup. If his LV function is normal on the echo and his valves are unchanged, we may consider a R/L heart cath to definitively exclude CAD and assess pulmonary pressures. Note of lung scarring on cardiac CT last year. He may ultimately benefit from a pulmonary consult.  ? ?Current medicines are reviewed at length with the patient today.  The patient does not have concerns regarding medicines. ? ?The following changes have been made:  no change ? ?Labs/ tests ordered today include:  ? ?Orders Placed This Encounter  ?Procedures  ? ECHOCARDIOGRAM COMPLETE  ? ? ? ?Disposition:   F/U with me in 4-5 months.  ? ? ?Signed, ?Lauree Chandler, MD ?02/04/2022 11:36 AM    ?[facility] ?[address], [place], [place]  [address] ?Phone: [phone]; Fax: [phone]  ? ?

## 2022-02-04 ENCOUNTER — Other Ambulatory Visit: Payer: Self-pay | Admitting: *Deleted

## 2022-02-04 ENCOUNTER — Encounter: Payer: Self-pay | Admitting: Cardiovascular Disease

## 2022-02-04 ENCOUNTER — Ambulatory Visit (INDEPENDENT_AMBULATORY_CARE_PROVIDER_SITE_OTHER): Payer: Medicare Other | Admitting: Cardiovascular Disease

## 2022-02-04 VITALS — BP 128/62 | HR 62 | Ht 72.0 in | Wt 197.8 lb

## 2022-02-04 DIAGNOSIS — R0609 Other forms of dyspnea: Secondary | ICD-10-CM

## 2022-02-04 DIAGNOSIS — I10 Essential (primary) hypertension: Secondary | ICD-10-CM | POA: Diagnosis not present

## 2022-02-04 DIAGNOSIS — Z01812 Encounter for preprocedural laboratory examination: Secondary | ICD-10-CM

## 2022-02-04 DIAGNOSIS — I2510 Atherosclerotic heart disease of native coronary artery without angina pectoris: Secondary | ICD-10-CM | POA: Diagnosis not present

## 2022-02-04 DIAGNOSIS — I712 Thoracic aortic aneurysm, without rupture, unspecified: Secondary | ICD-10-CM | POA: Diagnosis not present

## 2022-02-04 NOTE — Progress Notes (Signed)
Pre lab exam for ct aorta. ?

## 2022-02-04 NOTE — Patient Instructions (Signed)
Medication Instructions:  ?No changes ?*If you need a refill on your cardiac medications before your next appointment, please call your pharmacy* ? ? ?Lab Work: ?none ?If you have labs (blood work) drawn today and your tests are completely normal, you will receive your results only by: ?MyChart Message (if you have MyChart) OR ?A paper copy in the mail ?If you have any lab test that is abnormal or we need to change your treatment, we will call you to review the results. ? ? ?Testing/Procedures: ?Your physician has requested that you have an echocardiogram. Echocardiography is a painless test that uses sound waves to create images of your heart. It provides your doctor with information about the size and shape of your heart and how well your heart?s chambers and valves are working. This procedure takes approximately one hour. There are no restrictions for this procedure. ? ? ?Follow-Up: ?As planned in September 2023 ? ? ?Other Instructions ?  ?

## 2022-02-08 NOTE — Addendum Note (Signed)
Addended by: Janan Halter F on: 02/08/2022 03:43 PM ? ? Modules accepted: Orders ? ?

## 2022-02-09 ENCOUNTER — Other Ambulatory Visit: Payer: Self-pay | Admitting: *Deleted

## 2022-02-18 ENCOUNTER — Ambulatory Visit (HOSPITAL_COMMUNITY): Payer: Medicare Other | Attending: Cardiology

## 2022-02-18 DIAGNOSIS — I2510 Atherosclerotic heart disease of native coronary artery without angina pectoris: Secondary | ICD-10-CM | POA: Diagnosis present

## 2022-02-18 DIAGNOSIS — I10 Essential (primary) hypertension: Secondary | ICD-10-CM | POA: Insufficient documentation

## 2022-02-18 DIAGNOSIS — I712 Thoracic aortic aneurysm, without rupture, unspecified: Secondary | ICD-10-CM | POA: Diagnosis present

## 2022-02-18 DIAGNOSIS — R0609 Other forms of dyspnea: Secondary | ICD-10-CM | POA: Insufficient documentation

## 2022-02-18 LAB — ECHOCARDIOGRAM COMPLETE
Area-P 1/2: 3.48 cm2
P 1/2 time: 466 msec
S' Lateral: 2.5 cm

## 2022-02-23 ENCOUNTER — Telehealth: Payer: Self-pay | Admitting: *Deleted

## 2022-02-23 NOTE — Telephone Encounter (Signed)
-----   Message from Burnell Blanks, MD sent at 02/21/2022  8:52 AM EDT ----- ?His heart is strong. No significant valve disease. There is mild leakiness of the mitral valve. If he would like, I can see him back and we can talk about a right and left heart cath to measure the pressure in his lungs and also to make sure there is no progression of his mild CAD. Of note, we follow his mildly dilated aorta with CT scans. cdm ?

## 2022-02-23 NOTE — Telephone Encounter (Signed)
Called patient with echo results. Reviewed coming in to talk about right and left heart cath.  He said that he has been short of breath for a long time, but since he started the Toprol XL last year is when he really started to "give out" and seems to have made breathing a little worse.  He wonders if he could go back to his Lotrel.   ? ?I adv I would review with Dr. Angelena Form to see if he could trial off Toprol XL short term to see if he feels better, or consider HTN clinic for other options and I will call him back with recommendations.  Pt in agreement with this plan.   ?

## 2022-02-24 MED ORDER — AMLODIPINE BESY-BENAZEPRIL HCL 2.5-10 MG PO CAPS: 1.0000 | ORAL_CAPSULE | Freq: Every day | ORAL | 3 refills | Status: DC

## 2022-02-24 NOTE — Telephone Encounter (Signed)
I am ok with him stopping Toprol and resuming Lotrel 2.5/10 and follow BP at home. Gerald Stabs ? ? ? ?Spoke w patient.  He is in agreement and appreciative with this plan.  He will monitor BP at home. Will let us know if he continues to feel wore out. ?

## 2022-03-01 ENCOUNTER — Encounter: Payer: Self-pay | Admitting: Cardiovascular Disease

## 2022-04-11 ENCOUNTER — Other Ambulatory Visit: Payer: Self-pay | Admitting: Physician Assistant

## 2022-04-29 ENCOUNTER — Other Ambulatory Visit: Payer: Medicare Other

## 2022-04-29 DIAGNOSIS — Z01812 Encounter for preprocedural laboratory examination: Secondary | ICD-10-CM

## 2022-04-29 LAB — BASIC METABOLIC PANEL
BUN/Creatinine Ratio: 10 (ref 10–24)
BUN: 10 mg/dL (ref 8–27)
CO2: 23 mmol/L (ref 20–29)
Calcium: 9.5 mg/dL (ref 8.6–10.2)
Chloride: 104 mmol/L (ref 96–106)
Creatinine, Ser: 0.96 mg/dL (ref 0.76–1.27)
Glucose: 80 mg/dL (ref 70–99)
Potassium: 4.6 mmol/L (ref 3.5–5.2)
Sodium: 141 mmol/L (ref 134–144)
eGFR: 81 mL/min/{1.73_m2} (ref >59)

## 2022-05-03 ENCOUNTER — Other Ambulatory Visit: Payer: Self-pay | Admitting: *Deleted

## 2022-05-03 ENCOUNTER — Ambulatory Visit (HOSPITAL_COMMUNITY)
Admission: RE | Admit: 2022-05-03 | Discharge: 2022-05-03 | Disposition: A | Discharge status: Home / Self Care | Payer: Medicare Other | Source: Ambulatory Visit | Attending: Cardiovascular Disease | Admitting: Cardiovascular Disease

## 2022-05-03 ENCOUNTER — Encounter: Payer: Self-pay | Admitting: Cardiovascular Disease

## 2022-05-03 DIAGNOSIS — I712 Thoracic aortic aneurysm, without rupture, unspecified: Secondary | ICD-10-CM

## 2022-05-03 MED ORDER — IOHEXOL 350 MG/ML SOLN
100.0000 mL | Freq: Once | INTRAVENOUS | Status: AC | PRN
  Administered 2022-05-03: 100 mL via INTRAVENOUS

## 2022-06-28 ENCOUNTER — Ambulatory Visit (INDEPENDENT_AMBULATORY_CARE_PROVIDER_SITE_OTHER): Payer: Medicare Other | Admitting: Pulmonary Disease

## 2022-06-28 ENCOUNTER — Encounter: Payer: Self-pay | Admitting: Pulmonary Disease

## 2022-06-28 VITALS — BP 138/68 | HR 95 | Ht 71.5 in | Wt 193.0 lb

## 2022-06-28 DIAGNOSIS — R0602 Shortness of breath: Secondary | ICD-10-CM | POA: Diagnosis not present

## 2022-06-28 NOTE — Patient Instructions (Addendum)
Shortness of breath ORDER pulmonary function tests Encourage regular aerobic exercise and strength/resistance exercises daily  Follow-up with me in 2 weeks with PFTs prior to visit. OK to schedule separately if need

## 2022-06-28 NOTE — Progress Notes (Signed)
Subjective:   PATIENT ID: Patrick Braun GENDER: male DOB: [birth year], MRN: [id number]   HPI  Chief Complaint  Patient presents with   Consult    SOBE Had covid a year ago    Reason for Visit: New consult for shortness of breath  Mr. Patrick Braun is a 76 year old with colon cancer s/p resection, CAD, thoracic aneurysm, HTN, HLD who presents for new consult for shortness of breath.  Has shortness of breath in the last 3-4 years and worsening gradually. Infrequently gets respiratory infections.Denies coughing or wheezing. Denies nighttime symptoms. Did not feel albuterol this was helpful. Had COVID 1-2 years ago. He is unable to carry 50 lbs of feed and will have to rest after 2-3 bags. Shortness of breath is worse with upper body use. He walks daily. He was evaluated by Cardiology in the 04/2022 with negative CT Coronary with no evidence of worsening CAD.   Social History: [employer]. Cows and chicken (chickens gone) exposure. Hx spraying insecticides. Hay Smoked 10 years. Quit in 1970s.  I have personally reviewed patient's past medical/family/social history, allergies, current medications.  Past Medical History:  Diagnosis Date   Acute right-sided low back pain with right-sided sciatica    CAD (coronary artery disease)    Coronary CTA 6/22: Ascending aorta 44 mm, calcium score 25 (16th percentile), minimal nonobstructive CAD (1-24 in the RCA and LAD)   Constipation    DJD (degenerative joint disease)    Gastroesophageal cancer (HCC)    Hx of colon cancer, stage I    Hypercholesteremia    Hypertension    Right leg pain    Thoracic aortic aneurysm ([place])    CT 6/22: 66m     Family History  Problem Relation Age of Onset   Heart attack Father    Stroke Mother      Social History   Occupational History   Occupation: FPsychologist, sport and exercise Tobacco Use   Smoking status: Former    Types: Cigarettes    Quit date: 11/09/1971    Years since quitting: 50.6   Smokeless tobacco: Never   Vaping Use   Vaping Use: Never used  Substance and Sexual Activity   Alcohol use: Never   Drug use: Never   Sexual activity: Not on file    No Known Allergies   Outpatient Medications Prior to Visit  Medication Sig Dispense Refill   amlodipine-benazepril (LOTREL) 2.5-10 MG capsule Take 1 capsule by mouth daily. 90 capsule 3   aspirin 81 MG EC tablet Take 1 tablet by mouth daily.     escitalopram (LEXAPRO) 10 MG tablet Take 10 mg by mouth daily.     loratadine (CLARITIN) 10 MG tablet Take 10 mg by mouth daily as needed for allergies.     No facility-administered medications prior to visit.    Review of Systems  Constitutional:  Negative for chills, diaphoresis, fever, malaise/fatigue and weight loss.  HENT:  Negative for congestion.   Respiratory:  Positive for shortness of breath. Negative for cough, hemoptysis, sputum production and wheezing.   Cardiovascular:  Negative for chest pain, palpitations and leg swelling.     Objective:   Vitals:   06/28/22 1356  BP: 138/68  Pulse: 95  SpO2: 96%  Weight: 193 lb (87.5 kg)  Height: 5' 11.5" (1.816 m)   SpO2: 96 % O2 Device: None (Room air)  Physical Exam: General: Well-appearing, no acute distress HENT: [place], AT Eyes: EOMI, no scleral icterus Respiratory: Clear to auscultation  bilaterally.  No crackles, wheezing or rales Cardiovascular: RRR, -M/R/G, no JVD Extremities:-Edema,-tenderness Neuro: AAO x4, CNII-XII grossly intact Psych: Normal mood, normal affect  Data Reviewed:  Imaging: CTA Chest Aorta 05/03/22 - Visualized lung parenchyma with upper lobe scarring otherwise no pulmonary nodules, masses, infiltrate, effusion or pneumothorax.  PFT: None on file  Labs: CBC No results found for: "WBC", "RBC", "HGB", "HCT", "PLT", "MCV", "MCH", "MCHC", "RDW", "LYMPHSABS", "MONOABS", "EOSABS", "BASOSABS"    Assessment & Plan:   Discussion: 76 year old with colon cancer s/p resection, CAD, thoracic aneurysm, HTN, HLD  who presents for new consult for shortness of breath. Ambulatory O2 in-clinic with no desaturations. Gradually worsening dyspnea especially with upper body activity in the last 4 years. Consider deconditioning however he has been fairly active at baseline. Symptoms atypical for asthma and cardia work-up neg. Available CT imaging with no evidence of parenchymal lung disease but could consider HR CT if needed pending PFT results.  Shortness of breath ORDER pulmonary function tests Encourage regular aerobic exercise and strength/resistance exercises daily  Health Maintenance Immunization History  Administered Date(s) Administered   Influenza Split 08/30/2008   Influenza,inj,Quad PF,6+ Mos 09/11/2015, 10/11/2017, 09/19/2018, 09/06/2019, 09/08/2021   PFIZER(Purple Top)SARS-COV-2 Vaccination 11/19/2019, 12/11/2019, 12/02/2020   Pneumococcal Conjugate-13 05/31/2016   Pneumococcal Polysaccharide-23 11/09/2007, 05/01/2014   Td 11/08/2009   Tdap 05/01/2014   CT Lung Screen- not qualified. Insufficient smoking hx  Orders Placed This Encounter  Procedures   Pulmonary function test    Standing Status:   Future    Number of Occurrences:   1    Standing Expiration Date:   06/30/2023    Order Specific Question:   Where should this test be performed?    Answer:   [facility]    Order Specific Question:   Full PFT: includes the following: basic spirometry, spirometry pre & post bronchodilator, diffusion capacity (DLCO), lung volumes    Answer:   Full PFT  No orders of the defined types were placed in this encounter.   No follow-ups on file. After PFTs  I have spent a total time of 45-minutes on the day of the appointment reviewing prior documentation, coordinating care and discussing medical diagnosis and plan with the patient/family. Imaging, labs and tests included in this note have been reviewed and interpreted independently by me.  [place], MD [facility] 06/28/2022 2:17 PM  Office Number [phone]

## 2022-06-29 ENCOUNTER — Ambulatory Visit (INDEPENDENT_AMBULATORY_CARE_PROVIDER_SITE_OTHER): Payer: Medicare Other | Admitting: Pulmonary Disease

## 2022-06-29 DIAGNOSIS — R0602 Shortness of breath: Secondary | ICD-10-CM | POA: Diagnosis not present

## 2022-06-29 LAB — PULMONARY FUNCTION TEST
DL/VA % pred: 103 %
DL/VA: 4.03 ml/min/mmHg/L
DLCO cor % pred: 86 %
DLCO cor: 22.45 ml/min/mmHg
DLCO unc % pred: 86 %
DLCO unc: 22.45 ml/min/mmHg
FEF 25-75 Post: 3.19 L/sec
FEF 25-75 Pre: 2.12 L/sec
FEF2575-%Change-Post: 50 %
FEF2575-%Pred-Post: 140 %
FEF2575-%Pred-Pre: 93 %
FEV1-%Change-Post: 10 %
FEV1-%Pred-Post: 93 %
FEV1-%Pred-Pre: 85 %
FEV1-Post: 2.99 L
FEV1-Pre: 2.7 L
FEV1FVC-%Change-Post: 5 %
FEV1FVC-%Pred-Pre: 105 %
FEV6-%Change-Post: 5 %
FEV6-%Pred-Post: 90 %
FEV6-%Pred-Pre: 85 %
FEV6-Post: 3.73 L
FEV6-Pre: 3.55 L
FEV6FVC-%Pred-Post: 106 %
FEV6FVC-%Pred-Pre: 106 %
FVC-%Change-Post: 5 %
FVC-%Pred-Post: 84 %
FVC-%Pred-Pre: 80 %
FVC-Post: 3.73 L
FVC-Pre: 3.55 L
Post FEV1/FVC ratio: 80 %
Post FEV6/FVC ratio: 100 %
Pre FEV1/FVC ratio: 76 %
Pre FEV6/FVC Ratio: 100 %
RV % pred: 128 %
RV: 3.45 L
TLC % pred: 104 %
TLC: 7.68 L

## 2022-06-29 NOTE — Patient Instructions (Signed)
Full PFT performed today. °

## 2022-06-29 NOTE — Progress Notes (Signed)
Full PFT performed today. °

## 2022-07-06 ENCOUNTER — Telehealth: Payer: Self-pay | Admitting: Pulmonary Disease

## 2022-07-06 MED ORDER — FLUTICASONE-SALMETEROL 100-50 MCG/ACT IN AEPB: 1.0000 | INHALATION_SPRAY | Freq: Two times a day (BID) | RESPIRATORY_TRACT | 2 refills | Status: DC

## 2022-07-06 NOTE — Telephone Encounter (Signed)
[facility] Telephone Results  Contacted patient regarding PFTs  06/29/22 FVC 3.73 (84%) FEV1 2.99 (93%) Ratio 76  TLC 104% DLCO 86%. Borderline BD response but not significant Interpretation: Normal PFTs. No significant BD response however does not preclude inhaler use if benefit perceived  Assessment/Plan Cough variant asthma --START Advair 100-50 mcg ONE puff ONCE a day. Rinse after use  Follow-up scheduled for October to assess bronchodilator response  Rodman Pickle, M.D. [facility] Pulmonary/Critical Care Medicine 07/06/2022 8:58 AM

## 2022-07-28 ENCOUNTER — Ambulatory Visit: Payer: Medicare Other | Admitting: Cardiovascular Disease

## 2022-08-09 ENCOUNTER — Telehealth: Payer: Self-pay | Admitting: Pulmonary Disease

## 2022-08-09 ENCOUNTER — Ambulatory Visit (INDEPENDENT_AMBULATORY_CARE_PROVIDER_SITE_OTHER): Payer: Medicare Other | Admitting: Pulmonary Disease

## 2022-08-09 ENCOUNTER — Encounter: Payer: Self-pay | Admitting: Pulmonary Disease

## 2022-08-09 ENCOUNTER — Other Ambulatory Visit (HOSPITAL_COMMUNITY): Payer: Self-pay

## 2022-08-09 VITALS — BP 130/78 | HR 86 | Ht 71.0 in | Wt 196.6 lb

## 2022-08-09 DIAGNOSIS — Z23 Encounter for immunization: Secondary | ICD-10-CM | POA: Diagnosis not present

## 2022-08-09 DIAGNOSIS — J45991 Cough variant asthma: Secondary | ICD-10-CM

## 2022-08-09 MED ORDER — SPIRIVA RESPIMAT 1.25 MCG/ACT IN AERS: 2.0000 | INHALATION_SPRAY | Freq: Every day | RESPIRATORY_TRACT | 0 refills | Status: DC

## 2022-08-09 NOTE — Patient Instructions (Addendum)
Shortness of breath Cough variant asthma Normal PFTs --STOP Advair due to minimal benefit --TRIAL of Spiriva 1.25 mcg TWO puffs ONCE a day --Pharmacist inquiry sent for ICS/LABA options --Encourage regular aerobic exercise. Pulmonary Rehab offered. Declined for now but know this is an option in the future --Discussed vaccinations. Administer influenza today  Follow up with me in 4 months

## 2022-08-09 NOTE — Telephone Encounter (Signed)
Please send a test costs for:  Breo 100 Advair Diskus 100 Advair HFA 115 Symbicort 160 Dulera 100

## 2022-08-09 NOTE — Telephone Encounter (Signed)
Test billing for this patient's plan returns the following results ICS+LABA for products:  breo $40.00  advair disKus  $40.00 (BRAND) advair hfa $40.00 symbicort $40.00 (BRAND) dulera  NON FORMULARY

## 2022-08-09 NOTE — Progress Notes (Signed)
Subjective:   PATIENT ID: Patrick Braun GENDER: male DOB: 04-02-1946, MRN: [id number]   HPI  Chief Complaint  Patient presents with   Follow-up    Change in advair cost is $300    Reason for Visit: Follow-up  Mr. Darnelle Corp is a 76 year old with colon cancer s/p resection, CAD, thoracic aneurysm, HTN, HLD who presents for follow-up.  Initial consult Has shortness of breath in the last 3-4 years and worsening gradually. Infrequently gets respiratory infections.Denies coughing or wheezing. Denies nighttime symptoms. Did not feel albuterol this was helpful. Had COVID 1-2 years ago. He is unable to carry 50 lbs of feed and will have to rest after 2-3 bags. Shortness of breath is worse with upper body use. He walks daily. He was evaluated by Cardiology in the 04/2022 with negative CT Coronary with no evidence of worsening CAD.   08/09/22 Since our last visit, he has been compliant with Advair. He has had some improvement by ~25% with shortness of breath. Now having a cough after inhaler use. Denies wheezing. No exacerbations. Has not started regular exercise yet.  Social History: [employer]. Cows and chicken (chickens gone) exposure. Hx spraying insecticides. Hay Smoked 10 years. Quit in 1970s.  Past Medical History:  Diagnosis Date   Acute right-sided low back pain with right-sided sciatica    CAD (coronary artery disease)    Coronary CTA 6/22: Ascending aorta 44 mm, calcium score 25 (16th percentile), minimal nonobstructive CAD (1-24 in the RCA and LAD)   Constipation    DJD (degenerative joint disease)    Gastroesophageal cancer (HCC)    Hx of colon cancer, stage I    Hypercholesteremia    Hypertension    Right leg pain    Thoracic aortic aneurysm ([place])    CT 6/22: 54m     Family History  Problem Relation Age of Onset   Heart attack Father    Stroke Mother      Social History   Occupational History   Occupation: FPsychologist, sport and exercise Tobacco Use   Smoking status: Former     Packs/day: 1.00    Years: 10.00    Total pack years: 10.00    Types: Cigarettes    Quit date: 11/09/1971    Years since quitting: 50.7   Smokeless tobacco: Never  Vaping Use   Vaping Use: Never used  Substance and Sexual Activity   Alcohol use: Never   Drug use: Never   Sexual activity: Not on file    No Known Allergies   Outpatient Medications Prior to Visit  Medication Sig Dispense Refill   amlodipine-benazepril (LOTREL) 2.5-10 MG capsule Take 1 capsule by mouth daily. 90 capsule 3   aspirin 81 MG EC tablet Take 1 tablet by mouth daily.     fluticasone-salmeterol (ADVAIR DISKUS) 100-50 MCG/ACT AEPB Inhale 1 puff into the lungs 2 (two) times daily. 60 each 2   escitalopram (LEXAPRO) 10 MG tablet Take 10 mg by mouth daily. (Patient not taking: Reported on 08/09/2022)     loratadine (CLARITIN) 10 MG tablet Take 10 mg by mouth daily as needed for allergies. (Patient not taking: Reported on 08/09/2022)     No facility-administered medications prior to visit.    Review of Systems  Constitutional:  Negative for chills, diaphoresis, fever, malaise/fatigue and weight loss.  HENT:  Negative for congestion.   Respiratory:  Negative for cough, hemoptysis, sputum production, shortness of breath and wheezing.   Cardiovascular:  Negative for chest  pain, palpitations and leg swelling.     Objective:   Vitals:   08/09/22 0849  BP: 130/78  Pulse: 86  SpO2: 98%  Weight: 196 lb 9.6 oz (89.2 kg)  Height: '5\' 11"'$  (1.803 m)   SpO2: 98 % O2 Device: None (Room air)  Physical Exam: General: Well-appearing, no acute distress HENT: [place], AT Eyes: EOMI, no scleral icterus Respiratory: Clear to auscultation bilaterally.  No crackles, wheezing or rales Cardiovascular: RRR, -M/R/G, no JVD Extremities:-Edema,-tenderness Neuro: AAO x4, CNII-XII grossly intact Psych: Normal mood, normal affect  Data Reviewed:  Imaging: CTA Chest Aorta 05/03/22 - Visualized lung parenchyma with upper lobe  scarring otherwise no pulmonary nodules, masses, infiltrate, effusion or pneumothorax.  PFT: 06/29/22 FVC 3.73 (84%) FEV1 2.99 (93%) Ratio 76  TLC 104% DLCO 86%. Borderline BD response but not significant Interpretation: Normal PFTs. No significant BD response however does not preclude inhaler use if benefit perceived  Labs: CBC No results found for: "WBC", "RBC", "HGB", "HCT", "PLT", "MCV", "MCH", "MCHC", "RDW", "LYMPHSABS", "MONOABS", "EOSABS", "BASOSABS"    Assessment & Plan:   Discussion: 76 year old male with colon cancer s/p resection, CAD, thoracic aneurysm, HTN, HLD who presents for follow-up for shortness of breath. Gradually worsening dyspnea especially with upper body activity in the last 4 years. Likely deconditioning however he has been fairly active at baseline. Trialed ICS/LABA with some benefit however cost prohibitive ($300) and some new cough associated with inhaler. Will trial LAMA. If not effective will try cheaper ICS/LABA or discontinuing.  Cough variant asthma - persistent symptoms. Now with cough with inhaler Shortness of breath Normal PFTs --STOP Advair due to minimal benefit --TRIAL of Spiriva 1.25 mcg TWO puffs ONCE a day --Pharmacist inquiry sent for ICS/LABA options --Encourage regular aerobic exercise. Pulmonary Rehab offered. Declined for now but know this is an option in the future --Discussed vaccinations. Administer influenza today  Health Maintenance Immunization History  Administered Date(s) Administered   Fluad Quad(high Dose 65+) 08/09/2022   Influenza Split 08/30/2008   Influenza,inj,Quad PF,6+ Mos 09/11/2015, 10/11/2017, 09/19/2018, 09/06/2019, 09/08/2021   PFIZER(Purple Top)SARS-COV-2 Vaccination 11/19/2019, 12/11/2019, 12/02/2020   Pneumococcal Conjugate-13 05/31/2016   Pneumococcal Polysaccharide-23 11/09/2007, 05/01/2014   Td 11/08/2009   Tdap 05/01/2014   CT Lung Screen- not qualified. Insufficient smoking hx  Orders Placed This  Encounter  Procedures   Flu Vaccine QUAD High Dose(Fluad)  No orders of the defined types were placed in this encounter.   Return in about 4 months (around 12/10/2022).   I have spent a total time of 35-minutes on the day of the appointment including chart review, data review, collecting history, coordinating care and discussing medical diagnosis and plan with the patient/family. Past medical history, allergies, medications were reviewed. Pertinent imaging, labs and tests included in this note have been reviewed and interpreted independently by me.  [place], MD [facility] 08/09/2022 9:37 AM  Office Number [phone]

## 2022-08-09 NOTE — Addendum Note (Signed)
Addended by: Darliss Ridgel on: 08/09/2022 11:07 AM   Modules accepted: Orders

## 2022-08-09 NOTE — Telephone Encounter (Signed)
Attempted to reach patient. Left message regarding costs of inhalers. If he needs any ICS/LABA in the future, they should be affordable.

## 2022-08-29 NOTE — Progress Notes (Deleted)
No chief complaint on file.  History of Present Illness: 76 yo male with history of mild CAD, thoracic aortic aneurysm, gastroesophageal cancer, colon cancer, HTN, hyperlipidemia who is here today for cardiac follow up. I saw him as a new patient for the evaluation of dyspnea, chest pressure and fatigue on 02/08/20. 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 He described exertional dyspnea. 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 Nuclear stress test 02/18/20 with no ischemia. Echo April 2021 with LVEF=55-60% with no wall motion abnormalities. Mild MR. Mild AI. Mild dilation aortic root. Chest CTA with dilation of the aortic root, 4.4 cm. Coronary CTA June 2022 with coronary calcium score of 25 and minimal coronary plaque in the LAD. CT chest with evidence of bilateral lung scarring.  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 Echo 02/18/22 with LVEF=60-65%, moderate LVH. Mild MR. Chest CTA with stable 4.4 cm ascending aortic aneurysm June 2023.   He is here today for follow up. The patient denies any chest pain, dyspnea, palpitations, lower extremity edema, orthopnea, PND, dizziness, near syncope or syncope.   Primary Care Physician: Raelene Bott, MD  Past Medical History:  Diagnosis Date   Acute right-sided low back pain with right-sided sciatica    CAD (coronary artery disease)    Coronary CTA 6/22: Ascending aorta 44 mm, calcium score 25 (16th percentile), minimal nonobstructive CAD (1-24 in the RCA and LAD)   Constipation    DJD (degenerative joint disease)    Gastroesophageal cancer (HCC)    Hx of colon cancer, stage I    Hypercholesteremia    Hypertension    Right leg pain    Thoracic aortic aneurysm ([place])    CT 6/22: 73m    Past Surgical History:  Procedure Laterality Date   CHOLECYSTECTOMY     PARTIAL COLECTOMY      Current Outpatient Medications  Medication Sig Dispense Refill   amlodipine-benazepril (LOTREL) 2.5-10 MG capsule Take 1 capsule  by mouth daily. 90 capsule 3   aspirin 81 MG EC tablet Take 1 tablet by mouth daily.     escitalopram (LEXAPRO) 10 MG tablet Take 10 mg by mouth daily. (Patient not taking: Reported on 08/09/2022)     fluticasone-salmeterol (ADVAIR DISKUS) 100-50 MCG/ACT AEPB Inhale 1 puff into the lungs 2 (two) times daily. 60 each 2   loratadine (CLARITIN) 10 MG tablet Take 10 mg by mouth daily as needed for allergies. (Patient not taking: Reported on 08/09/2022)     Tiotropium Bromide Monohydrate (SPIRIVA RESPIMAT) 1.25 MCG/ACT AERS Inhale 2 puffs into the lungs daily. 4 g 0   No current facility-administered medications for this visit.    No Known Allergies  Social History   Socioeconomic History   Marital status: Married    Spouse name: Not on file   Number of children: Not on file   Years of education: Not on file   Highest education level: Not on file  Occupational History   Occupation: FPsychologist, sport and exercise Tobacco Use   Smoking status: Former    Packs/day: 1.00    Years: 10.00    Total pack years: 10.00    Types: Cigarettes    Quit date: 11/09/1971    Years since quitting: 50.8   Smokeless tobacco: Never  Vaping Use   Vaping Use: Never used  Substance and Sexual Activity   Alcohol use: Never   Drug use: Never   Sexual activity: Not on file  Other Topics Concern   Not on file  Social History Narrative   Not on file   Social Determinants of Health   Financial Resource Strain: Not on file  Food Insecurity: Not on file  Transportation Needs: Not on file  Physical Activity: Not on file  Stress: Not on file  Social Connections: Not on file  Intimate Partner Violence: Not on file    Family History  Problem Relation Age of Onset   Heart attack Father    Stroke Mother     Review of Systems:  As stated in the HPI and otherwise negative.   There were no vitals taken for this visit.  Physical Examination: General: Well developed, well nourished, NAD  HEENT: OP clear, mucus membranes moist   SKIN: warm, dry. No rashes. Neuro: No focal deficits  Musculoskeletal: Muscle strength 5/5 all ext  Psychiatric: Mood and affect normal  Neck: No JVD, no carotid bruits, no thyromegaly, no lymphadenopathy.  Lungs:Clear bilaterally, no wheezes, rhonci, crackles Cardiovascular: Regular rate and rhythm. No murmurs, gallops or rubs. Abdomen:Soft. Bowel sounds present. Non-tender.  Extremities: No lower extremity edema. Pulses are 2 + in the bilateral DP/PT.  EKG:  EKG is  *** ordered today. The ekg ordered today demonstrates  Echo April 2023:  1. Left ventricular ejection fraction, by estimation, is 60 to 65%. The  left ventricle has normal function. The left ventricle has no regional  wall motion abnormalities. There is moderate concentric left ventricular  hypertrophy of the basal-septal  segment. Left ventricular diastolic parameters are consistent with Grade I  diastolic dysfunction (impaired relaxation).   2. Right ventricular systolic function is normal. The right ventricular  size is normal. There is normal pulmonary artery systolic pressure.   3. Left atrial size was mild to moderately dilated.   4. Right atrial size was mild to moderately dilated.   5. The mitral valve is degenerative. Mild mitral valve regurgitation. No  evidence of mitral stenosis.   6. The aortic valve is tricuspid. There is moderate calcification of the  aortic valve. Aortic valve regurgitation is trivial. Aortic valve  sclerosis/calcification is present, without any evidence of aortic  stenosis.   7. Aortic dilatation noted. There is moderate dilatation of the ascending  aorta, measuring 45 mm.   8. The inferior vena cava is normal in size with greater than 50%  respiratory variability, suggesting right atrial pressure of 3 mmHg.   Coronary CTA 05/07/21: 1. Coronary calcium score of 25. This was 16th percentile for age, sex, and race matched control.   2. Normal coronary origin with left dominance.    3. CAD-RADS 1. Minimal non-obstructive CAD (1-24%). Consider non-atherosclerotic causes of chest pain. Consider preventive therapy and risk factor modification.   4. Mild dilation of the ascending aorta 44 mm. Aortic atherosclerosis. Please seen concomitant CT Aorta for more detail.   5. Aortic valve calcium score of 341.   6. Mild mitral annular calcification.   7. Coronary sinus dilation 20 mm.  Recent Labs: 04/29/2022: BUN 10; Creatinine, Ser 0.96; Potassium 4.6; Sodium 141   Lipid Panel No results found for: "CHOL", "TRIG", "HDL", "CHOLHDL", "VLDL", "LDLCALC", "LDLDIRECT"   Wt Readings from Last 3 Encounters:  08/09/22 196 lb 9.6 oz (89.2 kg)  06/28/22 193 lb (87.5 kg)  02/04/22 197 lb 12.8 oz (89.7 kg)    Assessment and Plan:   1.  CAD without angina: He has very mild CAD on cardiac CT June 2022. His dyspnea is felt  to be related to his lung disease. Continue ASA and *** ( is he on Toprol.) He does not wish to take a statin.    2. HTN: BP is well controlled.   3. Thoracic aortic aneurysm: 4.4 cm ascending aorta on chest CTA June 2023.    4. Dyspnea on exertion: I suspect that his dyspnea is related to his lungs. 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 No prior pulmonary workup.   Labs/ tests ordered today include:   No orders of the defined types were placed in this encounter.    Disposition:   F/U with me in 12 months.    Signed, Lauree Chandler, MD 08/29/2022 1:10 PM    [facility] [address], [place], [place]  [address] Phone: [phone]; Fax: [phone]

## 2022-08-30 ENCOUNTER — Ambulatory Visit: Payer: Medicare Other | Attending: Cardiovascular Disease | Admitting: Cardiovascular Disease

## 2023-02-03 ENCOUNTER — Other Ambulatory Visit: Payer: Self-pay | Admitting: Cardiovascular Disease

## 2023-03-04 ENCOUNTER — Other Ambulatory Visit: Payer: Self-pay | Admitting: Cardiovascular Disease

## 2023-04-18 ENCOUNTER — Telehealth: Payer: Self-pay | Admitting: *Deleted

## 2023-04-18 NOTE — Telephone Encounter (Signed)
Damita Lack, RN Called patient to schedule CT chest.  He said he wanted to talk to Dr Clifton James first. He is considering having something that requires a "needle" and I had no idea what he was talking about.  However, he said that if he had that test, he would not need to have the CT.  Please call patient and discuss options and let me know if he still want the CT chest.  Thank you ___________________________________________________________  Left message for patient to call back.

## 2023-04-19 NOTE — Telephone Encounter (Signed)
Pt is returning call.  

## 2023-04-19 NOTE — Telephone Encounter (Signed)
I called the patient and discussed.  He has longstanding shortness of breath/fatigue w activity.  It is not worse.  Last year a R/L heart cath was recommended however at that time the patient said he thought the symptoms may be from a medication change so the meds were changed back.  He thought that helped for a little while but now not so much.    I scheduled him back - moved up his Sept appointment to discuss w MD.

## 2023-05-13 ENCOUNTER — Ambulatory Visit (HOSPITAL_COMMUNITY)
Admission: RE | Admit: 2023-05-13 | Discharge: 2023-05-13 | Disposition: A | Discharge status: Home / Self Care | Payer: Medicare Other | Source: Ambulatory Visit | Attending: Cardiovascular Disease | Admitting: Cardiovascular Disease

## 2023-05-13 DIAGNOSIS — I712 Thoracic aortic aneurysm, without rupture, unspecified: Secondary | ICD-10-CM | POA: Insufficient documentation

## 2023-05-13 MED ORDER — IOHEXOL 350 MG/ML SOLN
75.0000 mL | Freq: Once | INTRAVENOUS | Status: AC | PRN
  Administered 2023-05-13: 75 mL via INTRAVENOUS

## 2023-05-17 NOTE — H&P (View-Only) (Signed)
No chief complaint on file.  History of Present Illness: 77 yo male with history of mild CAD, thoracic aortic aneurysm, gastroesophageal cancer, colon cancer, HTN and hyperlipidemia who is here today for cardiac follow up. I saw him as a new patient for the evaluation of dyspnea, chest pressure and fatigue in April 2021. 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 He described exertional dyspnea at his first visit here in 2021. Nuclear stress test 02/18/20 with no ischemia. Echo April 2021 with LVEF=55-60% with no wall motion abnormalities. Mild MR. Mild AI. Mild dilation aortic root. Chest CTA with dilation of the aortic root, 4.4 cm. Coronary CTA June 2022 with coronary calcium score of 25 and minimal coronary plaque in the LAD and RCA. He called our office in April 2023 reporting worsened dyspnea. He has had chronic dyspnea  and CT chest has shown bilateral lung scarring. 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 Echo April 2023 with LVEF=60-65%. Moderate LVH. Mild mitral regurgitation. Dilated aortic root. Chest CTA July 2024 with stable 4.4 cm aneurysm of the ascending thoracic aorta.   He is here today for follow up. The patient denies any chest pain, dyspnea, palpitations, lower extremity edema, orthopnea, PND, dizziness, near syncope or syncope.   Primary Care Physician: Lindwood Qua, MD  Past Medical History:  Diagnosis Date   Acute right-sided low back pain with right-sided sciatica    CAD (coronary artery disease)    Coronary CTA 6/22: Ascending aorta 44 mm, calcium score 25 (16th percentile), minimal nonobstructive CAD (1-24 in the RCA and LAD)   Constipation    DJD (degenerative joint disease)    Gastroesophageal cancer (HCC)    Hx of colon cancer, stage I    Hypercholesteremia    Hypertension    Right leg pain    Thoracic aortic aneurysm (HCC)    CT 6/22: 44mm    Past Surgical History:  Procedure Laterality Date   CHOLECYSTECTOMY      PARTIAL COLECTOMY      Current Outpatient Medications  Medication Sig Dispense Refill   amlodipine-benazepril (LOTREL) 2.5-10 MG capsule TAKE 1 CAPSULE BY MOUTH EVERY DAY 15 capsule 0   aspirin 81 MG EC tablet Take 1 tablet by mouth daily.     escitalopram (LEXAPRO) 10 MG tablet Take 10 mg by mouth daily. (Patient not taking: Reported on 08/09/2022)     fluticasone-salmeterol (ADVAIR DISKUS) 100-50 MCG/ACT AEPB Inhale 1 puff into the lungs 2 (two) times daily. 60 each 2   loratadine (CLARITIN) 10 MG tablet Take 10 mg by mouth daily as needed for allergies. (Patient not taking: Reported on 08/09/2022)     Tiotropium Bromide Monohydrate (SPIRIVA RESPIMAT) 1.25 MCG/ACT AERS Inhale 2 puffs into the lungs daily. 4 g 0   No current facility-administered medications for this visit.    No Known Allergies  Social History   Socioeconomic History   Marital status: Married    Spouse name: Not on file   Number of children: Not on file   Years of education: Not on file   Highest education level: Not on file  Occupational History   Occupation: Visual merchandiser  Tobacco Use   Smoking status: Former    Packs/day: 1.00    Years: 10.00    Additional pack years: 0.00    Total pack years: 10.00    Types: Cigarettes    Quit date: 11/09/1971    Years since quitting: 51.5  Smokeless tobacco: Never  Vaping Use   Vaping Use: Never used  Substance and Sexual Activity   Alcohol use: Never   Drug use: Never   Sexual activity: Not on file  Other Topics Concern   Not on file  Social History Narrative   Not on file   Social Determinants of Health   Financial Resource Strain: Not on file  Food Insecurity: Not on file  Transportation Needs: Not on file  Physical Activity: Not on file  Stress: Not on file  Social Connections: Not on file  Intimate Partner Violence: Not on file    Family History  Problem Relation Age of Onset   Heart attack Father    Stroke Mother     Review of Systems:  As stated in  the HPI and otherwise negative.   There were no vitals taken for this visit.  Physical Examination: General: Well developed, well nourished, NAD  HEENT: OP clear, mucus membranes moist  SKIN: warm, dry. No rashes. Neuro: No focal deficits  Musculoskeletal: Muscle strength 5/5 all ext  Psychiatric: Mood and affect normal  Neck: No JVD, no carotid bruits, no thyromegaly, no lymphadenopathy.  Lungs:Clear bilaterally, no wheezes, rhonci, crackles Cardiovascular: Regular rate and rhythm. No murmurs, gallops or rubs. Abdomen:Soft. Bowel sounds present. Non-tender.  Extremities: No lower extremity edema. Pulses are 2 + in the bilateral DP/PT.  EKG:  EKG is  *** ordered today. The ekg ordered today demonstrates   Echo April 2023:  1. Left ventricular ejection fraction, by estimation, is 60 to 65%. The  left ventricle has normal function. The left ventricle has no regional  wall motion abnormalities. There is moderate concentric left ventricular  hypertrophy of the basal-septal  segment. Left ventricular diastolic parameters are consistent with Grade I  diastolic dysfunction (impaired relaxation).   2. Right ventricular systolic function is normal. The right ventricular  size is normal. There is normal pulmonary artery systolic pressure.   3. Left atrial size was mild to moderately dilated.   4. Right atrial size was mild to moderately dilated.   5. The mitral valve is degenerative. Mild mitral valve regurgitation. No  evidence of mitral stenosis.   6. The aortic valve is tricuspid. There is moderate calcification of the  aortic valve. Aortic valve regurgitation is trivial. Aortic valve  sclerosis/calcification is present, without any evidence of aortic  stenosis.   7. Aortic dilatation noted. There is moderate dilatation of the ascending  aorta, measuring 45 mm.   8. The inferior vena cava is normal in size with greater than 50%  respiratory variability, suggesting right atrial  pressure of 3 mmHg.   Coronary CTA 05/07/21: 1. Coronary calcium score of 25. This was 16th percentile for age, sex, and race matched control.   2. Normal coronary origin with left dominance.   3. CAD-RADS 1. Minimal non-obstructive CAD (1-24%). Consider non-atherosclerotic causes of chest pain. Consider preventive therapy and risk factor modification.   4. Mild dilation of the ascending aorta 44 mm. Aortic atherosclerosis. Please seen concomitant CT Aorta for more detail.   5. Aortic valve calcium score of 341.   6. Mild mitral annular calcification.   7. Coronary sinus dilation 20 mm.  Recent Labs: No results found for requested labs within last 365 days.   Lipid Panel No results found for: "CHOL", "TRIG", "HDL", "CHOLHDL", "VLDL", "LDLCALC", "LDLDIRECT"   Wt Readings from Last 3 Encounters:  08/09/22 89.2 kg  06/28/22 87.5 kg  02/04/22 89.7 kg  Assessment and Plan:   1.  CAD without angina: He has very mild CAD on cardiac CT June 2022. I do not think his dyspnea is related to his mild CAD. LV function was normal by echo in 2023. in 2021. Given his dyspnea, we have in the past discussed a definitive right and left heart cath. *** Continue ASA and Toprol. He does not wish to take a statin.  2. HTN: BP is well controlled. Continue current therapy  3. Thoracic aortic aneurysm: Stable 4.4 cm ascending aorta on chest CTA July 2024. Will repeat chest CTA in July 2025.   4. Dyspnea on exertion: I suspect that his dyspnea is related to his lungs. 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 No prior pulmonary workup. His LV function is normal on the echo and there is no severe valvular disease. *** We may consider a R/L heart cath to definitively exclude CAD and assess pulmonary pressures. Note of lung scarring on cardiac CT. He may ultimately benefit from a pulmonary consult.   Labs/ tests ordered today include:  No orders of the  defined types were placed in this encounter.  Disposition:   F/U with me in 6 *** months.   Signed, Verne Carrow, MD 05/17/2023 3:17 PM    [facility] [address] [place], [place], [place]  [address] Phone: [phone]; Fax: [phone]

## 2023-05-17 NOTE — Progress Notes (Unsigned)
No chief complaint on file.  History of Present Illness: 77 yo male with history of mild CAD, thoracic aortic aneurysm, gastroesophageal cancer, colon cancer, HTN and hyperlipidemia who is here today for cardiac follow up. I saw him as a new patient for the evaluation of dyspnea, chest pressure and fatigue in April 2021. 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 He described exertional dyspnea at his first visit here in 2021. Nuclear stress test 02/18/20 with no ischemia. Echo April 2021 with LVEF=55-60% with no wall motion abnormalities. Mild MR. Mild AI. Mild dilation aortic root. Chest CTA with dilation of the aortic root, 4.4 cm. Coronary CTA June 2022 with coronary calcium score of 25 and minimal coronary plaque in the LAD and RCA. He called our office in April 2023 reporting worsened dyspnea. He has had chronic dyspnea  and CT chest has shown bilateral lung scarring. 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 Echo April 2023 with LVEF=60-65%. Moderate LVH. Mild mitral regurgitation. Dilated aortic root. Chest CTA July 2024 with stable 4.4 cm aneurysm of the ascending thoracic aorta.   He is here today for follow up. The patient denies any chest pain, dyspnea, palpitations, lower extremity edema, orthopnea, PND, dizziness, near syncope or syncope.   Primary Care Physician: Lindwood Qua, MD  Past Medical History:  Diagnosis Date   Acute right-sided low back pain with right-sided sciatica    CAD (coronary artery disease)    Coronary CTA 6/22: Ascending aorta 44 mm, calcium score 25 (16th percentile), minimal nonobstructive CAD (1-24 in the RCA and LAD)   Constipation    DJD (degenerative joint disease)    Gastroesophageal cancer (HCC)    Hx of colon cancer, stage I    Hypercholesteremia    Hypertension    Right leg pain    Thoracic aortic aneurysm (HCC)    CT 6/22: 44mm    Past Surgical History:  Procedure Laterality Date   CHOLECYSTECTOMY      PARTIAL COLECTOMY      Current Outpatient Medications  Medication Sig Dispense Refill   amlodipine-benazepril (LOTREL) 2.5-10 MG capsule TAKE 1 CAPSULE BY MOUTH EVERY DAY 15 capsule 0   aspirin 81 MG EC tablet Take 1 tablet by mouth daily.     escitalopram (LEXAPRO) 10 MG tablet Take 10 mg by mouth daily. (Patient not taking: Reported on 08/09/2022)     fluticasone-salmeterol (ADVAIR DISKUS) 100-50 MCG/ACT AEPB Inhale 1 puff into the lungs 2 (two) times daily. 60 each 2   loratadine (CLARITIN) 10 MG tablet Take 10 mg by mouth daily as needed for allergies. (Patient not taking: Reported on 08/09/2022)     Tiotropium Bromide Monohydrate (SPIRIVA RESPIMAT) 1.25 MCG/ACT AERS Inhale 2 puffs into the lungs daily. 4 g 0   No current facility-administered medications for this visit.    No Known Allergies  Social History   Socioeconomic History   Marital status: Married    Spouse name: Not on file   Number of children: Not on file   Years of education: Not on file   Highest education level: Not on file  Occupational History   Occupation: Visual merchandiser  Tobacco Use   Smoking status: Former    Packs/day: 1.00    Years: 10.00    Additional pack years: 0.00    Total pack years: 10.00    Types: Cigarettes    Quit date: 11/09/1971    Years since quitting: 51.5  Smokeless tobacco: Never  Vaping Use   Vaping Use: Never used  Substance and Sexual Activity   Alcohol use: Never   Drug use: Never   Sexual activity: Not on file  Other Topics Concern   Not on file  Social History Narrative   Not on file   Social Determinants of Health   Financial Resource Strain: Not on file  Food Insecurity: Not on file  Transportation Needs: Not on file  Physical Activity: Not on file  Stress: Not on file  Social Connections: Not on file  Intimate Partner Violence: Not on file    Family History  Problem Relation Age of Onset   Heart attack Father    Stroke Mother     Review of Systems:  As stated in  the HPI and otherwise negative.   There were no vitals taken for this visit.  Physical Examination: General: Well developed, well nourished, NAD  HEENT: OP clear, mucus membranes moist  SKIN: warm, dry. No rashes. Neuro: No focal deficits  Musculoskeletal: Muscle strength 5/5 all ext  Psychiatric: Mood and affect normal  Neck: No JVD, no carotid bruits, no thyromegaly, no lymphadenopathy.  Lungs:Clear bilaterally, no wheezes, rhonci, crackles Cardiovascular: Regular rate and rhythm. No murmurs, gallops or rubs. Abdomen:Soft. Bowel sounds present. Non-tender.  Extremities: No lower extremity edema. Pulses are 2 + in the bilateral DP/PT.  EKG:  EKG is  *** ordered today. The ekg ordered today demonstrates   Echo April 2023:  1. Left ventricular ejection fraction, by estimation, is 60 to 65%. The  left ventricle has normal function. The left ventricle has no regional  wall motion abnormalities. There is moderate concentric left ventricular  hypertrophy of the basal-septal  segment. Left ventricular diastolic parameters are consistent with Grade I  diastolic dysfunction (impaired relaxation).   2. Right ventricular systolic function is normal. The right ventricular  size is normal. There is normal pulmonary artery systolic pressure.   3. Left atrial size was mild to moderately dilated.   4. Right atrial size was mild to moderately dilated.   5. The mitral valve is degenerative. Mild mitral valve regurgitation. No  evidence of mitral stenosis.   6. The aortic valve is tricuspid. There is moderate calcification of the  aortic valve. Aortic valve regurgitation is trivial. Aortic valve  sclerosis/calcification is present, without any evidence of aortic  stenosis.   7. Aortic dilatation noted. There is moderate dilatation of the ascending  aorta, measuring 45 mm.   8. The inferior vena cava is normal in size with greater than 50%  respiratory variability, suggesting right atrial  pressure of 3 mmHg.   Coronary CTA 05/07/21: 1. Coronary calcium score of 25. This was 16th percentile for age, sex, and race matched control.   2. Normal coronary origin with left dominance.   3. CAD-RADS 1. Minimal non-obstructive CAD (1-24%). Consider non-atherosclerotic causes of chest pain. Consider preventive therapy and risk factor modification.   4. Mild dilation of the ascending aorta 44 mm. Aortic atherosclerosis. Please seen concomitant CT Aorta for more detail.   5. Aortic valve calcium score of 341.   6. Mild mitral annular calcification.   7. Coronary sinus dilation 20 mm.  Recent Labs: No results found for requested labs within last 365 days.   Lipid Panel No results found for: "CHOL", "TRIG", "HDL", "CHOLHDL", "VLDL", "LDLCALC", "LDLDIRECT"   Wt Readings from Last 3 Encounters:  08/09/22 89.2 kg  06/28/22 87.5 kg  02/04/22 89.7 kg  Assessment and Plan:   1.  CAD without angina: He has very mild CAD on cardiac CT June 2022. I do not think his dyspnea is related to his mild CAD. LV function was normal by echo in 2023. in 2021. Given his dyspnea, we have in the past discussed a definitive right and left heart cath. *** Continue ASA and Toprol. He does not wish to take a statin.  2. HTN: BP is well controlled. Continue current therapy  3. Thoracic aortic aneurysm: Stable 4.4 cm ascending aorta on chest CTA July 2024. Will repeat chest CTA in July 2025.   4. Dyspnea on exertion: I suspect that his dyspnea is related to his lungs. 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 No prior pulmonary workup. His LV function is normal on the echo and there is no severe valvular disease. *** We may consider a R/L heart cath to definitively exclude CAD and assess pulmonary pressures. Note of lung scarring on cardiac CT. He may ultimately benefit from a pulmonary consult.   Labs/ tests ordered today include:  No orders of the  defined types were placed in this encounter.  Disposition:   F/U with me in 6 *** months.   Signed, Verne Carrow, MD 05/17/2023 3:17 PM    [facility] [address] [place], [place], [place]  [address] Phone: [phone]; Fax: [phone]

## 2023-05-18 ENCOUNTER — Ambulatory Visit: Payer: Medicare Other | Attending: Cardiovascular Disease | Admitting: Cardiovascular Disease

## 2023-05-18 ENCOUNTER — Encounter: Payer: Self-pay | Admitting: Cardiovascular Disease

## 2023-05-18 VITALS — BP 134/88 | HR 90 | Ht 72.0 in | Wt 193.8 lb

## 2023-05-18 DIAGNOSIS — I712 Thoracic aortic aneurysm, without rupture, unspecified: Secondary | ICD-10-CM | POA: Insufficient documentation

## 2023-05-18 DIAGNOSIS — Z01812 Encounter for preprocedural laboratory examination: Secondary | ICD-10-CM | POA: Insufficient documentation

## 2023-05-18 DIAGNOSIS — I10 Essential (primary) hypertension: Secondary | ICD-10-CM | POA: Diagnosis present

## 2023-05-18 DIAGNOSIS — R0609 Other forms of dyspnea: Secondary | ICD-10-CM | POA: Insufficient documentation

## 2023-05-18 DIAGNOSIS — I2510 Atherosclerotic heart disease of native coronary artery without angina pectoris: Secondary | ICD-10-CM | POA: Diagnosis not present

## 2023-05-18 NOTE — Patient Instructions (Signed)
Medication Instructions:  No changes *If you need a refill on your cardiac medications before your next appointment, please call your pharmacy*   Lab Work: Today: bmet, cbc   Testing/Procedures: Your physician has requested that you have a cardiac catheterization. Cardiac catheterization is used to diagnose and/or treat various heart conditions. Doctors may recommend this procedure for a number of different reasons. The most common reason is to evaluate chest pain. Chest pain can be a symptom of coronary artery disease (CAD), and cardiac catheterization can show whether plaque is narrowing or blocking your heart's arteries. This procedure is also used to evaluate the valves, as well as measure the blood flow and oxygen levels in different parts of your heart. For further information please visit [email or web address] Please follow instruction sheet, as given.    Follow-Up: At [facility], you and your health needs are our priority.  As part of our continuing mission to provide you with exceptional heart care, we have created designated Provider Care Teams.  These Care Teams include your primary Cardiologist (physician) and Advanced Practice Providers (APPs -  Physician Assistants and Nurse Practitioners) who all work together to provide you with the care you need, when you need it.   Your next appointment:   4 week(s)  Provider:   Advanced Practice Provider (NP or PA-C)        Cardiac/Peripheral Catheterization   You are scheduled for a Cardiac Catheterization on Wednesday, July 24 with Dr. Verne Carrow.  1. Please arrive at the [facility] (Main Entrance A) at [facility]: [address] [place], [place] [address] at 6:30 AM (This time is TWO hour(s) before your procedure to ensure your preparation). Free valet parking service is available. You will check in at ADMITTING. The support person will be asked to wait in the waiting room.  It is OK to have someone  drop you off and come back when you are ready to be discharged.        Special note: Every effort is made to have your procedure done on time. Please understand that emergencies sometimes delay scheduled procedures.  2. Diet: Do not eat solid foods after midnight.  You may have clear liquids until 5 AM the day of the procedure.  3. Labs: You will need to have blood drawn on today. You do not need to be fasting.  4. Medication instructions in preparation for your procedure:   Contrast Allergy: No   On the morning of your procedure, take Aspirin 81 mg and any morning medicines NOT listed above.  You may use sips of water.  5. Plan to go home the same day, you will only stay overnight if medically necessary. 6. You MUST have a responsible adult to drive you home. 7. An adult MUST be with you the first 24 hours after you arrive home. 8. Bring a current list of your medications, and the last time and date medication taken. 9. Bring ID and current insurance cards. 10.Please wear clothes that are easy to get on and off and wear slip-on shoes.  Thank you for allowing [place] to care for you!   -- [facility]

## 2023-05-19 ENCOUNTER — Telehealth: Payer: Self-pay | Admitting: Cardiovascular Disease

## 2023-05-19 LAB — CBC
Hematocrit: 47 % (ref 37.5–51.0)
Hemoglobin: 15.8 g/dL (ref 13.0–17.7)
MCH: 28.7 pg (ref 26.6–33.0)
MCHC: 33.6 g/dL (ref 31.5–35.7)
MCV: 86 fL (ref 79–97)
Platelets: 307 10*3/uL (ref 150–450)
RBC: 5.5 x10E6/uL (ref 4.14–5.80)
RDW: 13.1 % (ref 11.6–15.4)
WBC: 7.5 10*3/uL (ref 3.4–10.8)

## 2023-05-19 LAB — BASIC METABOLIC PANEL
BUN/Creatinine Ratio: 14 (ref 10–24)
BUN: 13 mg/dL (ref 8–27)
CO2: 24 mmol/L (ref 20–29)
Calcium: 9.7 mg/dL (ref 8.6–10.2)
Chloride: 100 mmol/L (ref 96–106)
Creatinine, Ser: 0.93 mg/dL (ref 0.76–1.27)
Glucose: 88 mg/dL (ref 70–99)
Potassium: 4.2 mmol/L (ref 3.5–5.2)
Sodium: 140 mmol/L (ref 134–144)
eGFR: 84 mL/min/{1.73_m2} (ref >59)

## 2023-05-19 NOTE — Telephone Encounter (Signed)
Follow Up:       Patient is retuning a call from today, concerning his test results.

## 2023-05-19 NOTE — Telephone Encounter (Signed)
Pt advised his precath labs and his CT that he says that he went over with Dr Clifton James already... he will call if he has any further questions.

## 2023-05-31 ENCOUNTER — Telehealth: Payer: Self-pay | Admitting: *Deleted

## 2023-05-31 NOTE — Telephone Encounter (Signed)
Cardiac Catheterization scheduled at [facility] for: Wednesday June 01, 2023 8:30 AM Arrival time [facility] Main Entrance A at: 6:30 AM  Nothing to eat after midnight prior to procedure, clear liquids until 5 AM day of procedure.  Medication instructions: -Usual morning medications can be taken with sips of water including aspirin 81 mg.  Confirmed patient has responsible adult to drive home post procedure and be with patient first 24 hours after arriving home.  Plan to go home the same day, you will only stay overnight if medically necessary.  Reviewed procedure instructions with patient.

## 2023-06-01 ENCOUNTER — Other Ambulatory Visit: Payer: Self-pay

## 2023-06-01 ENCOUNTER — Ambulatory Visit (HOSPITAL_COMMUNITY)
Admission: RE | Admit: 2023-06-01 | Discharge: 2023-06-01 | Disposition: A | Discharge status: Home / Self Care | Payer: Medicare Other | Attending: Cardiovascular Disease | Admitting: Cardiovascular Disease

## 2023-06-01 ENCOUNTER — Encounter (HOSPITAL_COMMUNITY)
Admission: RE | Disposition: A | Discharge status: Home / Self Care | Payer: Self-pay | Source: Home / Self Care | Attending: Cardiovascular Disease

## 2023-06-01 DIAGNOSIS — I2510 Atherosclerotic heart disease of native coronary artery without angina pectoris: Secondary | ICD-10-CM | POA: Diagnosis not present

## 2023-06-01 DIAGNOSIS — Z85038 Personal history of other malignant neoplasm of large intestine: Secondary | ICD-10-CM | POA: Insufficient documentation

## 2023-06-01 DIAGNOSIS — I10 Essential (primary) hypertension: Secondary | ICD-10-CM | POA: Diagnosis not present

## 2023-06-01 DIAGNOSIS — R0609 Other forms of dyspnea: Secondary | ICD-10-CM | POA: Diagnosis not present

## 2023-06-01 DIAGNOSIS — R0602 Shortness of breath: Secondary | ICD-10-CM

## 2023-06-01 DIAGNOSIS — Z8679 Personal history of other diseases of the circulatory system: Secondary | ICD-10-CM | POA: Insufficient documentation

## 2023-06-01 DIAGNOSIS — E785 Hyperlipidemia, unspecified: Secondary | ICD-10-CM | POA: Insufficient documentation

## 2023-06-01 DIAGNOSIS — Z87891 Personal history of nicotine dependence: Secondary | ICD-10-CM | POA: Diagnosis not present

## 2023-06-01 HISTORY — PX: RIGHT/LEFT HEART CATH AND CORONARY ANGIOGRAPHY: CATH118266

## 2023-06-01 LAB — POCT I-STAT EG7
Acid-base deficit: 2 mmol/L (ref 0.0–2.0)
Bicarbonate: 24.3 mmol/L (ref 20.0–28.0)
Calcium, Ion: 1.12 mmol/L — ABNORMAL LOW (ref 1.15–1.40)
HCT: 41 % (ref 39.0–52.0)
Hemoglobin: 13.9 g/dL (ref 13.0–17.0)
O2 Saturation: 70 %
Potassium: 3.9 mmol/L (ref 3.5–5.1)
Sodium: 136 mmol/L (ref 135–145)
TCO2: 26 mmol/L (ref 22–32)
pCO2, Ven: 48.7 mmHg (ref 44–60)
pH, Ven: 7.307 (ref 7.25–7.43)
pO2, Ven: 41 mmHg (ref 32–45)

## 2023-06-01 SURGERY — RIGHT/LEFT HEART CATH AND CORONARY ANGIOGRAPHY
Anesthesia: LOCAL

## 2023-06-01 MED ORDER — ASPIRIN 81 MG PO CHEW: 81.0000 mg | CHEWABLE_TABLET | ORAL | Status: DC

## 2023-06-01 MED ORDER — HEPARIN SODIUM (PORCINE) 1000 UNIT/ML IJ SOLN
INTRAMUSCULAR | Status: DC | PRN
  Administered 2023-06-01: 4500 [IU] via INTRAVENOUS

## 2023-06-01 MED ORDER — SODIUM CHLORIDE 0.9 % IV SOLN: 250.0000 mL | INTRAVENOUS | Status: DC | PRN

## 2023-06-01 MED ORDER — VERAPAMIL HCL 2.5 MG/ML IV SOLN
INTRAVENOUS | Status: DC | PRN
  Administered 2023-06-01: 10 mL via INTRA_ARTERIAL

## 2023-06-01 MED ORDER — FENTANYL CITRATE (PF) 100 MCG/2ML IJ SOLN
INTRAMUSCULAR | Status: AC
  Filled 2023-06-01: qty 2

## 2023-06-01 MED ORDER — HEPARIN (PORCINE) IN NACL 1000-0.9 UT/500ML-% IV SOLN
INTRAVENOUS | Status: DC | PRN
  Administered 2023-06-01 (×2): 500 mL

## 2023-06-01 MED ORDER — SODIUM CHLORIDE 0.9% FLUSH: 3.0000 mL | INTRAVENOUS | Status: DC | PRN

## 2023-06-01 MED ORDER — HEPARIN SODIUM (PORCINE) 1000 UNIT/ML IJ SOLN
INTRAMUSCULAR | Status: AC
  Filled 2023-06-01: qty 10

## 2023-06-01 MED ORDER — ONDANSETRON HCL 4 MG/2ML IJ SOLN: 4.0000 mg | Freq: Four times a day (QID) | INTRAMUSCULAR | Status: DC | PRN

## 2023-06-01 MED ORDER — MIDAZOLAM HCL 2 MG/2ML IJ SOLN
INTRAMUSCULAR | Status: DC | PRN
  Administered 2023-06-01: 1 mg via INTRAVENOUS

## 2023-06-01 MED ORDER — VERAPAMIL HCL 2.5 MG/ML IV SOLN
INTRAVENOUS | Status: AC
  Filled 2023-06-01: qty 2

## 2023-06-01 MED ORDER — LIDOCAINE HCL (PF) 1 % IJ SOLN
INTRAMUSCULAR | Status: DC | PRN
  Administered 2023-06-01 (×2): 2 mL

## 2023-06-01 MED ORDER — MIDAZOLAM HCL 2 MG/2ML IJ SOLN
INTRAMUSCULAR | Status: AC
  Filled 2023-06-01: qty 2

## 2023-06-01 MED ORDER — SODIUM CHLORIDE 0.9% FLUSH: 3.0000 mL | Freq: Two times a day (BID) | INTRAVENOUS | Status: DC

## 2023-06-01 MED ORDER — SODIUM CHLORIDE 0.9 % WEIGHT BASED INFUSION
3.0000 mL/kg/h | INTRAVENOUS | Status: AC
  Administered 2023-06-01: 3 mL/kg/h via INTRAVENOUS

## 2023-06-01 MED ORDER — LABETALOL HCL 5 MG/ML IV SOLN: 10.0000 mg | INTRAVENOUS | Status: DC | PRN

## 2023-06-01 MED ORDER — IOHEXOL 350 MG/ML SOLN
INTRAVENOUS | Status: DC | PRN
  Administered 2023-06-01: 17 mL via INTRA_ARTERIAL

## 2023-06-01 MED ORDER — LIDOCAINE HCL (PF) 1 % IJ SOLN
INTRAMUSCULAR | Status: AC
  Filled 2023-06-01: qty 30

## 2023-06-01 MED ORDER — ACETAMINOPHEN 325 MG PO TABS: 650.0000 mg | ORAL_TABLET | ORAL | Status: DC | PRN

## 2023-06-01 MED ORDER — FENTANYL CITRATE (PF) 100 MCG/2ML IJ SOLN
INTRAMUSCULAR | Status: DC | PRN
  Administered 2023-06-01: 25 ug via INTRAVENOUS

## 2023-06-01 MED ORDER — SODIUM CHLORIDE 0.9 % WEIGHT BASED INFUSION: 1.0000 mL/kg/h | INTRAVENOUS | Status: DC

## 2023-06-01 MED ORDER — SODIUM CHLORIDE 0.9 % IV SOLN: INTRAVENOUS | Status: DC

## 2023-06-01 MED ORDER — HYDRALAZINE HCL 20 MG/ML IJ SOLN: 10.0000 mg | INTRAMUSCULAR | Status: DC | PRN

## 2023-06-01 SURGICAL SUPPLY — 13 items
CATH 5FR JL3.5 JR4 ANG PIG MP (CATHETERS) IMPLANT
CATH BALLN WEDGE 5F 110CM (CATHETERS) IMPLANT
DEVICE RAD COMP TR BAND LRG (VASCULAR PRODUCTS) IMPLANT
GLIDESHEATH SLEND SS 6F .021 (SHEATH) IMPLANT
GUIDEWIRE INQWIRE 1.5J.035X260 (WIRE) IMPLANT
INQWIRE 1.5J .035X260CM (WIRE) ×1
KIT HEART LEFT (KITS) ×1 IMPLANT
KIT SYRINGE INJ CVI SPIKEX1 (MISCELLANEOUS) IMPLANT
PACK CARDIAC CATHETERIZATION (CUSTOM PROCEDURE TRAY) ×1 IMPLANT
SET ATX-X65L (MISCELLANEOUS) IMPLANT
SHEATH GLIDE SLENDER 4/5FR (SHEATH) IMPLANT
TRANSDUCER W/STOPCOCK (MISCELLANEOUS) ×1 IMPLANT
TUBING CIL FLEX 10 FLL-RA (TUBING) ×1 IMPLANT

## 2023-06-01 NOTE — Interval H&P Note (Signed)
History and Physical Interval Note:  06/01/2023 8:10 AM  Patrick Braun  has presented today for surgery, with the diagnosis of dsynea.  The various methods of treatment have been discussed with the patient and family. After consideration of risks, benefits and other options for treatment, the patient has consented to  Procedure(s): RIGHT/LEFT HEART CATH AND CORONARY ANGIOGRAPHY (N/A) as a surgical intervention.  The patient's history has been reviewed, patient examined, no change in status, stable for surgery.  I have reviewed the patient's chart and labs.  Questions were answered to the patient's satisfaction.    Cath Lab Visit (complete for each Cath Lab visit)  Clinical Evaluation Leading to the Procedure:   ACS: No.  Non-ACS:    Anginal Classification: CCS II  Anti-ischemic medical therapy: Minimal Therapy (1 class of medications)  Non-Invasive Test Results: No non-invasive testing performed  Prior CABG: No previous CABG        Verne Carrow

## 2023-06-02 ENCOUNTER — Encounter (HOSPITAL_COMMUNITY): Payer: Self-pay | Admitting: Cardiovascular Disease

## 2023-06-02 LAB — POCT I-STAT 7, (LYTES, BLD GAS, ICA,H+H)
Acid-base deficit: 1 mmol/L (ref 0.0–2.0)
Calcium, Ion: 1.16 mmol/L (ref 1.15–1.40)
HCT: 41 % (ref 39.0–52.0)
Hemoglobin: 13.9 g/dL (ref 13.0–17.0)
Potassium: 4.1 mmol/L (ref 3.5–5.1)
Sodium: 142 mmol/L (ref 135–145)
TCO2: 26 mmol/L (ref 22–32)
pCO2 arterial: 44.3 mmHg (ref 32–48)
pH, Arterial: 7.352 (ref 7.35–7.45)
pO2, Arterial: 75 mmHg — ABNORMAL LOW (ref 83–108)

## 2023-06-09 ENCOUNTER — Encounter: Payer: Self-pay | Admitting: Pulmonary Disease

## 2023-06-14 NOTE — Progress Notes (Signed)
Cardiology Office Note:    Date:  06/15/2023  ID:  Patrick Braun, DOB 01-04-1946, MRN [id number] PCP: Lindwood Qua, MD  [facility] Providers Cardiologist:  Verne Carrow, MD       Patient Profile:      Coronary artery disease, nonobstructive   Myoview 4/21: no ischemia TTE 02/19/2020: EF 55-60, no RWMA, mild asymmetric LVH, normal diastolic function, normal RVSF, RVSP 20.6, mild MR, mild chordal SAM, mild AI, mild AV sclerosis without stenosis, dilated ascending aorta (42 mm)  CCTA 05/07/2021: CAC score 25 (16th percentile); minimal nonobstructive CAD; aortic 44 mm TTE 02/18/2022: EF 60-65, no RWMA, moderate LVH, G1 DD, normal RVSF, PASP, mild to moderate BAE, mild MR, trivial AI, AV sclerosis, moderate dilation of ascending aorta (45 mm), RAP 3 R/LHC 06/01/2023: Mid LAD 20, distal LCx 20; normal right heart pressures (mean PA 15, PCWP 9) Chronic shortness of breath  Mild MR, Mild AI by echocardiogram in 2021; trivial AI and mild MR on TTE in 02/2022 Thoracic aortic aneurysm Chest CTA 05/13/2023: 4.4 cm Gastroesophageal CA Colon CA Hypertension  Hyperlipidemia  Rheumatic fever as a child           Discussed the use of AI scribe software for clinical note transcription with the patient, who gave verbal consent to proceed.  History of Present Illness   [name] with a history of chronic dyspnea, hyperlipidemia, and a thoracic aortic aneurysm presents for follow-up after recent cardiac catheterization. The patient has been experiencing shortness of breath, particularly when using his arms. Despite this, the patient is able to walk long distances without experiencing shortness of breath. 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  The patient has been followed by pulmonology and placed on inhalers. Despite this, the patient's shortness of breath persists. The patient's stress testing in 2021 demonstrated no ischemia, and  an echocardiogram in 2023 demonstrated normal EF with mild diastolic dysfunction. He was seen by Dr. Clifton James recently with continued symptoms. He underwent cardiac catheterization that demonstrated no CAD with normal right heart pressures.  The patient has also been experiencing balance issues and has fallen a few times. The patient attributes this to old age. 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      ROS: See HPI    Studies Reviewed:        Risk Assessment/Calculations:             Physical Exam:   VS:  BP 122/68   Pulse 70   Ht 6' (1.829 m)   Wt 196 lb 12.8 oz (89.3 kg)   SpO2 97%   BMI 26.69 kg/m    Wt Readings from Last 3 Encounters:  06/15/23 196 lb 12.8 oz (89.3 kg)  06/01/23 195 lb (88.5 kg)  05/18/23 193 lb 12.8 oz (87.9 kg)    GEN: Well nourished, well developed in no acute distress  NECK: No JVD CARDIAC: RRR, no murmurs, rubs, gallops RESPIRATORY:  Clear to auscultation without rales, wheezing or rhonchi  ABDOMEN: Soft EXTREMITIES:  No edema R wrist w/o hematoma    Assessment and Plan:  Shortness of breath Recent cardiac cath reassuring with no obvious cardiac cause for dyspnea.  Pulmonology follow-up recommended for further evaluation. -Schedule follow-up with pulmonology.  CAD (coronary artery disease) Minimal plaque on arteries. Patient on aspirin 81mg  daily. -Continue aspirin 81mg  daily.  Thoracic aortic  aneurysm without rupture (HCC) Stable measurements as of July 2024. -Plan for follow-up scan in July 2025.  Pure hypercholesterolemia Labs from PCP reviewed via Care Everywhere. Total cholesterol of 235 and LDL of 177 noted in January 2024. 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 Mod intensity statin Rx recommended. He declines this. I have encouraged him to notify us if he changes his mind.  Loss of balance Recent falls due to tripping, possible balance issues. -Recommend evaluation by physical therapy  for balance management.  Assessment and Plan        Dispo:  Return in about 6 months (around 12/16/2023) for Routine Follow Up w/ Dr. Clifton James.  Signed, Tereso Newcomer, PA-C

## 2023-06-15 ENCOUNTER — Ambulatory Visit: Payer: Medicare Other | Attending: Physician Assistant | Admitting: Physician Assistant

## 2023-06-15 ENCOUNTER — Encounter: Payer: Self-pay | Admitting: Physician Assistant

## 2023-06-15 VITALS — BP 122/68 | HR 70 | Ht 72.0 in | Wt 196.8 lb

## 2023-06-15 DIAGNOSIS — I7121 Aneurysm of the ascending aorta, without rupture: Secondary | ICD-10-CM | POA: Insufficient documentation

## 2023-06-15 DIAGNOSIS — R0602 Shortness of breath: Secondary | ICD-10-CM | POA: Insufficient documentation

## 2023-06-15 DIAGNOSIS — R2689 Other abnormalities of gait and mobility: Secondary | ICD-10-CM | POA: Insufficient documentation

## 2023-06-15 DIAGNOSIS — I2510 Atherosclerotic heart disease of native coronary artery without angina pectoris: Secondary | ICD-10-CM | POA: Insufficient documentation

## 2023-06-15 DIAGNOSIS — E7800 Pure hypercholesterolemia, unspecified: Secondary | ICD-10-CM | POA: Diagnosis present

## 2023-06-15 NOTE — Assessment & Plan Note (Signed)
Recent falls due to tripping, possible balance issues. -Recommend evaluation by physical therapy for balance management.

## 2023-06-15 NOTE — Assessment & Plan Note (Signed)
Stable measurements as of July 2024. -Plan for follow-up scan in July 2025.

## 2023-06-15 NOTE — Assessment & Plan Note (Signed)
Minimal plaque on arteries. Patient on aspirin 81mg  daily. -Continue aspirin 81mg  daily.

## 2023-06-15 NOTE — Assessment & Plan Note (Signed)
Labs from PCP reviewed via Care Everywhere. Total cholesterol of 235 and LDL of 177 noted in January 2024. 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 Mod intensity statin Rx recommended. He declines this. I have encouraged him to notify us if he changes his mind.

## 2023-06-15 NOTE — Patient Instructions (Signed)
Medication Instructions:    Your physician recommends that you continue on your current medications as directed. Please refer to the Current Medication list given to you today.   *If you need a refill on your cardiac medications before your next appointment, please call your pharmacy*   Lab Work: NONE ORDERED  TODAY   If you have labs (blood work) drawn today and your tests are completely normal, you will receive your results only by: MyChart Message (if you have MyChart) OR A paper copy in the mail If you have any lab test that is abnormal or we need to change your treatment, we will call you to review the results.   Testing/Procedures: NONE ORDERED  TODAY     Follow-Up: At [facility], you and your health needs are our priority.  As part of our continuing mission to provide you with exceptional heart care, we have created designated Provider Care Teams.  These Care Teams include your primary Cardiologist (physician) and Advanced Practice Providers (APPs -  Physician Assistants and Nurse Practitioners) who all work together to provide you with the care you need, when you need it.  We recommend signing up for the patient portal called "MyChart".  Sign up information is provided on this After Visit Summary.  MyChart is used to connect with patients for Virtual Visits (Telemedicine).  Patients are able to view lab/test results, encounter notes, upcoming appointments, etc.  Non-urgent messages can be sent to your provider as well.   To learn more about what you can do with MyChart, go to [email or web address].    Your next appointment:   6 month(s)  Provider:   Verne Carrow, MD     Other Instructions

## 2023-06-15 NOTE — Assessment & Plan Note (Signed)
Recent cardiac cath reassuring with no obvious cardiac cause for dyspnea.  Pulmonology follow-up recommended for further evaluation. -Schedule follow-up with pulmonology.

## 2023-08-08 ENCOUNTER — Ambulatory Visit: Payer: Medicare Other | Admitting: Cardiovascular Disease

## 2023-12-12 ENCOUNTER — Encounter: Payer: Self-pay | Admitting: Cardiovascular Disease

## 2023-12-12 ENCOUNTER — Ambulatory Visit: Payer: Medicare Other | Attending: Cardiovascular Disease | Admitting: Cardiovascular Disease

## 2023-12-12 VITALS — BP 128/74 | HR 83 | Ht 72.0 in | Wt 202.0 lb

## 2023-12-12 DIAGNOSIS — I340 Nonrheumatic mitral (valve) insufficiency: Secondary | ICD-10-CM | POA: Insufficient documentation

## 2023-12-12 DIAGNOSIS — I712 Thoracic aortic aneurysm, without rupture, unspecified: Secondary | ICD-10-CM | POA: Diagnosis present

## 2023-12-12 DIAGNOSIS — R0609 Other forms of dyspnea: Secondary | ICD-10-CM | POA: Insufficient documentation

## 2023-12-12 DIAGNOSIS — I2510 Atherosclerotic heart disease of native coronary artery without angina pectoris: Secondary | ICD-10-CM | POA: Insufficient documentation

## 2023-12-12 DIAGNOSIS — I10 Essential (primary) hypertension: Secondary | ICD-10-CM | POA: Insufficient documentation

## 2023-12-12 NOTE — Progress Notes (Signed)
Chief Complaint  Patient presents with   Follow-up    CAD   History of Present Illness: 78 yo male with history of mild CAD, thoracic aortic aneurysm, gastroesophageal cancer, colon cancer, HTN and hyperlipidemia who is here today for cardiac follow up. I saw him as a new patient for the evaluation of dyspnea, chest pressure and fatigue in April 2021. 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 He described exertional dyspnea at his first visit here in 2021. Nuclear stress test 02/18/20 with no ischemia. Echo April 2021 with LVEF=55-60% with no wall motion abnormalities. Mild MR. Mild AI. Mild dilation aortic root. Chest CTA with dilation of the aortic root, 4.4 cm. Coronary CTA June 2022 with coronary calcium score of 25 and minimal coronary plaque in the LAD and RCA. He called our office in April 2023 reporting worsened dyspnea. He has had chronic dyspnea  and CT chest has shown bilateral lung scarring. 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 Echo April 2023 with LVEF=60-65%. Moderate LVH. Mild mitral regurgitation. Dilated aortic root. Chest CTA July 2024 with stable 4.4 cm aneurysm of the ascending thoracic aorta. Cardiac cath July 2024 with mild CAD and normal right heart pressures.   He is here today for follow up. The patient denies any chest pain, dyspnea, palpitations, lower extremity edema, orthopnea, PND, dizziness, near syncope or syncope.   Primary Care Physician: Lindwood Qua, MD  Past Medical History:  Diagnosis Date   Acute right-sided low back pain with right-sided sciatica    CAD (coronary artery disease)    Coronary CTA 6/22: Ascending aorta 44 mm, calcium score 25 (16th percentile), minimal nonobstructive CAD (1-24 in the RCA and LAD)   Constipation    DJD (degenerative joint disease)    Gastroesophageal cancer (HCC)    Hx of colon cancer, stage I    Hypercholesteremia    Hypertension    Right leg pain    Thoracic aortic aneurysm  (HCC)    CT 6/22: 44mm    Past Surgical History:  Procedure Laterality Date   CHOLECYSTECTOMY     PARTIAL COLECTOMY     RIGHT/LEFT HEART CATH AND CORONARY ANGIOGRAPHY N/A 06/01/2023   Procedure: RIGHT/LEFT HEART CATH AND CORONARY ANGIOGRAPHY;  Surgeon: Kathleene Hazel, MD;  Location: [facility] INVASIVE CV LAB;  Service: Cardiovascular;  Laterality: N/A;    Current Outpatient Medications  Medication Sig Dispense Refill   amlodipine-benazepril (LOTREL) 2.5-10 MG capsule TAKE 1 CAPSULE BY MOUTH EVERY DAY 15 capsule 0   loratadine (CLARITIN) 10 MG tablet Take 10 mg by mouth daily as needed for allergies.     triamcinolone cream (KENALOG) 0.1 % Apply 1 Application topically 2 (two) times a week.     aspirin 81 MG EC tablet Take 81 mg by mouth daily. (Patient not taking: Reported on 12/12/2023)     No current facility-administered medications for this visit.    No Known Allergies  Social History   Socioeconomic History   Marital status: Married    Spouse name: Not on file   Number of children: Not on file   Years of education: Not on file   Highest education level: Not on file  Occupational History   Occupation: Visual merchandiser  Tobacco Use   Smoking status: Former    Current packs/day: 0.00    Average packs/day: 1 pack/day for 10.0 years (10.0 ttl pk-yrs)    Types: Cigarettes    Start date: 11/08/1961  Quit date: 11/09/1971    Years since quitting: 52.1   Smokeless tobacco: Never  Vaping Use   Vaping status: Never Used  Substance and Sexual Activity   Alcohol use: Never   Drug use: Never   Sexual activity: Not on file  Other Topics Concern   Not on file  Social History Narrative   Not on file   Social Drivers of Health   Financial Resource Strain: Low Risk  (12/20/2022)   Received from [facility], [facility]   Overall Financial Resource Strain (CARDIA)    Difficulty of Paying Living Expenses: Not hard at all  Food Insecurity: No Food Insecurity (12/20/2022)    Received from [facility], [facility]   Hunger Vital Sign    Worried About Running Out of Food in the Last Year: Never true    Ran Out of Food in the Last Year: Never true  Transportation Needs: No Transportation Needs (12/20/2022)   Received from [facility], [facility]   [facility] - Transportation    Lack of Transportation (Medical): No    Lack of Transportation (Non-Medical): No  Physical Activity: Sufficiently Active (12/20/2022)   Received from [facility], [facility]   Exercise Vital Sign    Days of Exercise per Week: 7 days    Minutes of Exercise per Session: 120 min  Stress: No Stress Concern Present (12/20/2022)   Received from [facility], [facility] - Occupational Stress Questionnaire    Feeling of Stress : Not at all  Social Connections: Socially Integrated (12/20/2022)   Received from [facility], [facility]   Social Connection and Isolation Panel [NHANES]    Frequency of Communication with Friends and Family: More than three times a week    Frequency of Social Gatherings with Friends and Family: More than three times a week    Attends Religious Services: More than 4 times per year    Active Member of [employer] or Organizations: Yes    Attends Engineer, structural: More than 4 times per year    Marital Status: Married  Catering manager Violence: Not At Risk (12/20/2022)   Received from [facility], [facility]   Humiliation, Afraid, Rape, and Kick questionnaire    Fear of Current or Ex-Partner: No    Emotionally Abused: No    Physically Abused: No    Sexually Abused: No    Family History  Problem Relation Age of Onset   Heart attack Father    Stroke Mother     Review of Systems:  As stated in the HPI and otherwise negative.   BP 128/74   Pulse 83   Ht 6' (1.829 m)   Wt 91.6 kg   SpO2 98%   BMI 27.40 kg/m   Physical Examination: General: Well developed, well  nourished, NAD  HEENT: OP clear, mucus membranes moist  SKIN: warm, dry. No rashes. Neuro: No focal deficits  Musculoskeletal: Muscle strength 5/5 all ext  Psychiatric: Mood and affect normal  Neck: No JVD, no carotid bruits, no thyromegaly, no lymphadenopathy.  Lungs:Clear bilaterally, no wheezes, rhonci, crackles Cardiovascular: Regular rate and rhythm. No murmurs, gallops or rubs. Abdomen:Soft. Bowel sounds present. Non-tender.  Extremities: No lower extremity edema. Pulses are 2 + in the bilateral DP/PT.  EKG:  EKG is not ordered today. The ekg ordered today demonstrates   Echo April 2023:  1. Left ventricular ejection fraction, by estimation, is 60 to 65%. The  left ventricle has normal function. The left ventricle has no regional  wall motion abnormalities. There is moderate concentric left ventricular  hypertrophy of the basal-septal  segment. Left ventricular diastolic parameters are consistent with Grade I  diastolic dysfunction (impaired relaxation).   2. Right ventricular systolic function is normal. The right ventricular  size is normal. There is normal pulmonary artery systolic pressure.   3. Left atrial size was mild to moderately dilated.   4. Right atrial size was mild to moderately dilated.   5. The mitral valve is degenerative. Mild mitral valve regurgitation. No  evidence of mitral stenosis.   6. The aortic valve is tricuspid. There is moderate calcification of the  aortic valve. Aortic valve regurgitation is trivial. Aortic valve  sclerosis/calcification is present, without any evidence of aortic  stenosis.   7. Aortic dilatation noted. There is moderate dilatation of the ascending  aorta, measuring 45 mm.   8. The inferior vena cava is normal in size with greater than 50%  respiratory variability, suggesting right atrial pressure of 3 mmHg.   Recent Labs: 05/18/2023: BUN 13; Creatinine, Ser 0.93; Platelets 307 06/01/2023: Hemoglobin 13.9; Potassium 4.1;  Sodium 142   Lipid Panel No results found for: "CHOL", "TRIG", "HDL", "CHOLHDL", "VLDL", "LDLCALC", "LDLDIRECT"   Wt Readings from Last 3 Encounters:  12/12/23 91.6 kg  06/15/23 89.3 kg  06/01/23 88.5 kg    Assessment and Plan:   1.  CAD without angina: He has very mild CAD by cardiac cath in July 2024. His dyspnea is not felt to be related to his CAD. LV function was normal by echo in 2023. in 2021. Continue ASA. He does not wish to take a statin.   2. HTN: BP is well controlled.   3. Thoracic aortic aneurysm: Stable 4.4 cm ascending aorta on chest CTA July 2024. Will repeat chest CTA in July 2025.    5. Mitral regurgitation: Mild by echo in April 2023. No loud murmur on exam today. Repeat echo in April 2026.   4. Dyspnea on exertion: His dyspnea is felt to be related to his lung disease. He has been exposed to chicken feathers, dust and chemicals for years. Scarring on lung CT. PFTS normal. His LV function is normal on the echo and there is no severe valvular disease. Cardiac cath as above.  Labs/ tests ordered today include:   Orders Placed This Encounter  Procedures   CT ANGIO CHEST AORTA W/CM & OR WO/CM   Basic Metabolic Panel (BMET)   Disposition:   F/U with me in 6 months.   Signed, Verne Carrow, MD 12/12/2023 10:06 AM    [facility] [address] [place], [place], [place]  [address] Phone: [phone]; Fax: [phone]

## 2023-12-12 NOTE — Patient Instructions (Signed)
Medication Instructions:  No changes *If you need a refill on your cardiac medications before your next appointment, please call your pharmacy*   Lab Work: Lab work before CT scan in July --go to any [facility].  If you have labs (blood work) drawn today and your tests are completely normal, you will receive your results only by: [employer] (if you have MyChart) OR A paper copy in the mail If you have any lab test that is abnormal or we need to change your treatment, we will call you to review the results.   Testing/Procedures: CT chest aorta - due July 2025   Follow-Up: At [facility], you and your health needs are our priority.  As part of our continuing mission to provide you with exceptional heart care, we have created designated Provider Care Teams.  These Care Teams include your primary Cardiologist (physician) and Advanced Practice Providers (APPs -  Physician Assistants and Nurse Practitioners) who all work together to provide you with the care you need, when you need it.  We recommend signing up for the patient portal called "MyChart".  Sign up information is provided on this After Visit Summary.  MyChart is used to connect with patients for Virtual Visits (Telemedicine).  Patients are able to view lab/test results, encounter notes, upcoming appointments, etc.  Non-urgent messages can be sent to your provider as well.   To learn more about what you can do with MyChart, go to [email or web address].    Your next appointment:   12 month(s)  Provider:   Verne Carrow, MD       1st Floor: - Lobby - Registration  - Pharmacy  - Lab - Cafe  2nd Floor: - PV Lab - Diagnostic Testing (echo, CT, nuclear med)  3rd Floor: - Vacant  4th Floor: - TCTS (cardiothoracic surgery) - AFib Clinic - Structural Heart Clinic - Vascular Surgery  - Vascular Ultrasound  5th Floor: - HeartCare Cardiology (general and EP) - Clinical Pharmacy for coumadin,  hypertension, lipid, weight-loss medications, and med management appointments    Valet parking services will be available as well.

## 2024-04-17 ENCOUNTER — Encounter: Payer: Self-pay | Admitting: Cardiovascular Disease

## 2024-05-31 LAB — BASIC METABOLIC PANEL WITH GFR
BUN/Creatinine Ratio: 15 (ref 10–24)
BUN: 15 mg/dL (ref 8–27)
CO2: 21 mmol/L (ref 20–29)
Calcium: 9.6 mg/dL (ref 8.6–10.2)
Chloride: 103 mmol/L (ref 96–106)
Creatinine, Ser: 0.99 mg/dL (ref 0.76–1.27)
Glucose: 96 mg/dL (ref 70–99)
Potassium: 4.9 mmol/L (ref 3.5–5.2)
Sodium: 141 mmol/L (ref 134–144)
eGFR: 77 mL/min/1.73 (ref >59)

## 2024-06-01 ENCOUNTER — Ambulatory Visit: Payer: Self-pay

## 2024-06-01 DIAGNOSIS — I712 Thoracic aortic aneurysm, without rupture, unspecified: Secondary | ICD-10-CM

## 2024-06-04 ENCOUNTER — Ambulatory Visit (HOSPITAL_COMMUNITY)
Admission: RE | Admit: 2024-06-04 | Discharge: 2024-06-04 | Disposition: A | Discharge status: Home / Self Care | Source: Ambulatory Visit | Attending: Cardiology | Admitting: Cardiology

## 2024-06-04 DIAGNOSIS — I712 Thoracic aortic aneurysm, without rupture, unspecified: Secondary | ICD-10-CM | POA: Diagnosis present

## 2024-06-04 MED ORDER — IOHEXOL 350 MG/ML SOLN
75.0000 mL | Freq: Once | INTRAVENOUS | Status: AC | PRN
  Administered 2024-06-04: 75 mL via INTRAVENOUS

## 2024-11-15 ENCOUNTER — Encounter: Payer: Self-pay | Admitting: Cardiovascular Disease
